# Patient Record
Sex: Male | Born: 1955 | Race: White | Hispanic: No | Marital: Married | Smoking: Never smoker
Health system: Southern US, Community
[De-identification: ages and names within clinical notes are randomized; demographics above are authoritative.]

## PROBLEM LIST (undated history)

## (undated) DIAGNOSIS — I10 Essential (primary) hypertension: Secondary | ICD-10-CM

## (undated) DIAGNOSIS — E785 Hyperlipidemia, unspecified: Secondary | ICD-10-CM

## (undated) DIAGNOSIS — R7303 Prediabetes: Secondary | ICD-10-CM

## (undated) DIAGNOSIS — K219 Gastro-esophageal reflux disease without esophagitis: Secondary | ICD-10-CM

## (undated) DIAGNOSIS — Z87442 Personal history of urinary calculi: Secondary | ICD-10-CM

## (undated) HISTORY — PX: TONSILLECTOMY: SUR1361

## (undated) HISTORY — DX: Essential (primary) hypertension: I10

## (undated) HISTORY — DX: Hyperlipidemia, unspecified: E78.5

## (undated) HISTORY — PX: FOOT SURGERY: SHX648

---

## 1975-05-31 HISTORY — PX: FOOT SURGERY: SHX648

## 1990-05-30 HISTORY — PX: FOOT FRACTURE SURGERY: SHX645

## 2005-10-27 ENCOUNTER — Ambulatory Visit: Payer: Self-pay | Admitting: Gastroenterology

## 2014-05-30 HISTORY — PX: COLONOSCOPY: SHX174

## 2015-01-05 DIAGNOSIS — E785 Hyperlipidemia, unspecified: Secondary | ICD-10-CM | POA: Insufficient documentation

## 2015-01-05 DIAGNOSIS — J309 Allergic rhinitis, unspecified: Secondary | ICD-10-CM | POA: Insufficient documentation

## 2016-12-27 DIAGNOSIS — K219 Gastro-esophageal reflux disease without esophagitis: Secondary | ICD-10-CM | POA: Insufficient documentation

## 2018-11-07 ENCOUNTER — Other Ambulatory Visit: Payer: Self-pay

## 2018-11-07 ENCOUNTER — Encounter: Payer: Self-pay | Admitting: *Deleted

## 2018-11-07 ENCOUNTER — Emergency Department: Payer: BC Managed Care – PPO

## 2018-11-07 ENCOUNTER — Emergency Department
Admission: EM | Admit: 2018-11-07 | Discharge: 2018-11-07 | Disposition: A | Discharge status: Home / Self Care | Payer: BC Managed Care – PPO | Attending: Emergency Medicine | Admitting: Emergency Medicine

## 2018-11-07 DIAGNOSIS — R0789 Other chest pain: Secondary | ICD-10-CM | POA: Insufficient documentation

## 2018-11-07 DIAGNOSIS — R079 Chest pain, unspecified: Secondary | ICD-10-CM | POA: Diagnosis present

## 2018-11-07 MED ORDER — METHOCARBAMOL 500 MG PO TABS: 500.0000 mg | ORAL_TABLET | Freq: Three times a day (TID) | ORAL | 0 refills | Status: AC | PRN

## 2018-11-07 MED ORDER — MELOXICAM 15 MG PO TABS: 15.0000 mg | ORAL_TABLET | Freq: Every day | ORAL | 1 refills | Status: AC

## 2018-11-07 NOTE — ED Notes (Signed)
Patient transported to X-ray 
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
## 2018-11-07 NOTE — Discharge Instructions (Signed)
Follow up with PCP regarding suspected thyroid goiter.

## 2018-12-25 DIAGNOSIS — E042 Nontoxic multinodular goiter: Secondary | ICD-10-CM | POA: Insufficient documentation

## 2019-01-15 ENCOUNTER — Other Ambulatory Visit: Payer: Self-pay | Admitting: General Surgery

## 2019-01-15 ENCOUNTER — Other Ambulatory Visit: Payer: Self-pay

## 2019-01-15 ENCOUNTER — Encounter: Payer: Self-pay | Admitting: General Surgery

## 2019-01-15 ENCOUNTER — Ambulatory Visit: Payer: BC Managed Care – PPO | Admitting: General Surgery

## 2019-01-15 VITALS — BP 178/97 | HR 76 | Temp 98.1°F | Ht 74.0 in | Wt 281.0 lb

## 2019-01-15 DIAGNOSIS — E049 Nontoxic goiter, unspecified: Secondary | ICD-10-CM
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
## 2019-01-15 NOTE — Patient Instructions (Addendum)
Thyroidectomy A thyroidectomy is a surgery that is done to remove the thyroid gland. The thyroid is a butterfly-shaped gland that is located at the lower front of your neck. It produces thyroid hormone, which is a substance that helps to control certain body processes. You may have a:  Total thyroidectomy. All of your thyroid is removed.  Thyroid lobectomy. Part of your thyroid is removed. The amount of thyroid gland tissue that is removed during your surgery depends on the reason for the procedure. Reasons to have this procedure include treatment for:  Thyroid nodules.  Thyroid cancer.  Benign thyroid tumors.  Goiter.  Overactive thyroid gland (hyperthyroidism). There are two ways to do this procedure. Conventional, or open, thyroidectomy uses one large incision to remove the thyroid gland. This is the most common method. Endoscopic thyroidectomy, a less invasive method, uses a narrow tube with a light and camera (endoscope) to remove the gland. Tell a health care provider about:  Any allergies you have.  All medicines you are taking, including vitamins, herbs, eye drops, creams, and over-the-counter medicines.  Any problems you or family members have had with anesthetic medicines.  Any blood disorders you have.  Any surgeries you have had.  Any medical conditions you have.  Whether you are pregnant or may be pregnant. What are the risks? Generally, this is a safe procedure. However, problems may occur, including:  Damage to the parathyroid glands. These are located behind your thyroid gland. They maintain the calcium levels in the body. Damage may lead to: ? A decrease in parathyroid hormone levels (hypoparathyroidism). ? A decrease in calcium levels. This will make your nerves irritable and may cause muscle spasms.  An increase in thyroid hormone.  Damage to the nerves of your voice box (larynx). This can be temporary or long-term (rare).  Hoarseness. This usually  resolves in 24-48 hours.  Bleeding.  Infection. What happens before the procedure? Staying hydrated Follow instructions from your health care provider about hydration, which may include:  Up to 2 hours before the procedure - you may continue to drink clear liquids, such as water, clear fruit juice, black coffee, and plain tea. Eating and drinking restrictions Follow instructions from your health care provider about eating and drinking, which may include:  8 hours before the procedure - stop eating heavy meals or foods such as meat, fried foods, or fatty foods.  6 hours before the procedure - stop eating light meals or foods, such as toast or cereal.  6 hours before the procedure - stop drinking milk or drinks that contain milk.  2 hours before the procedure - stop drinking clear liquids. Medicines Ask your health care provider about:  Changing or stopping your regular medicines. This is especially important if you are taking diabetes medicines or blood thinners.  Taking medicines such as aspirin and ibuprofen. These medicines can thin your blood. Do not take these medicines unless your health care provider tells you to take them.  Taking over-the-counter medicines, vitamins, herbs, and supplements. General instructions  You may be asked to shower with a germ-killing soap.  Plan to have someone take you home from the hospital or clinic.  Plan to have a responsible adult care for you for at least 24 hours after you leave the hospital or clinic. This is important. What happens during the procedure?  To reduce your risk of infection: ? Your health care team will wash or sanitize their hands. ? Hair may be removed from the surgical area. ?  Your skin will be washed with soap.  An IV will be inserted into one of your veins.  You will be given one or more of the following: ? A medicine to help you relax (sedative). ? A medicine to make you fall asleep (general anesthetic).   Your health care provider will perform your surgery using one of two methods: ? For open thyroidectomy, an incision will be made in your lower neck. Muscles in the area will be separated to reveal your thyroid gland. ? For endoscopic thyroidectomy, several small incisions will be made in your neck, chest, or armpit. An endoscopewill be inserted into an incision.  Your health care provider may monitor laryngeal nerve function during the procedure for safety reasons.  Part or all of your thyroid gland will be removed.  A tube (drain) may be placed at the incision site to drain blood and fluids that accumulate under the skin after the procedure. The drain may have to stay in place for a day or two after the procedure.  The incision will be closed with stitches (sutures).  A dressing will be placed over your incision. The procedure may vary among health care providers and hospitals. What happens after the procedure?  Your blood pressure, heart rate, breathing rate, and blood oxygen level will be monitored often until the medicines you were given have worn off.  You will be given pain medicine as needed.  Your provider will check your ability to talk and swallow after the procedure.  You will gradually start to drink liquids and have soft foods as tolerated.  You may have a blood test to check the level of calcium in your body.  If you had a drain put in during the procedure, it will usually be removed the next day. Summary  A thyroidectomy is a surgery that is done to remove the thyroid gland.  The procedure will be done in one of two ways: conventional, or open, thyroidectomy or endoscopic thyroidectomy.  Serious complications are rare.  Plan to have a responsible adult care for you for at least 24 hours after you leave the hospital or clinic. This is important. This information is not intended to replace advice given to you by your health care provider. Make sure you discuss any  questions you have with your health care provider. Document Released: 11/09/2000 Document Revised: 04/28/2017 Document Reviewed: 03/21/2017 Elsevier Patient Education  2020 [place] scan on 01/17/19 @ 12:45 pm [address]. appt 1:00

## 2019-01-16 ENCOUNTER — Telehealth: Payer: Self-pay | Admitting: *Deleted

## 2019-01-16 NOTE — Telephone Encounter (Signed)
Patient's surgery to be scheduled for 01-23-19 at [facility] with Dr. Celine Ahr. Dr. Nestor Lewandowsky will be assisting with this case.   The patient is aware to have COVID-19 testing done on 01-18-19 at the [facility] drive thru ([address] [place]). We will try and coordinate this with his pre-admit appointment. He is aware to isolate after, have no visitors, wash hands frequently, and avoid touching face.   The patient is aware he will need to Pre-Admit. Patient will be contacted once Pre-admission appointment has been arranged with date and time.   The patient will be contacted with further instructions once [facility] has posted.

## 2019-01-17 ENCOUNTER — Ambulatory Visit
Admission: RE | Admit: 2019-01-17 | Discharge: 2019-01-17 | Disposition: A | Discharge status: Home / Self Care | Payer: BC Managed Care – PPO | Source: Ambulatory Visit | Attending: General Surgery | Admitting: General Surgery

## 2019-01-17 ENCOUNTER — Other Ambulatory Visit: Payer: Self-pay

## 2019-01-17 DIAGNOSIS — E049 Nontoxic goiter, unspecified: Secondary | ICD-10-CM | POA: Diagnosis present

## 2019-01-17 NOTE — Telephone Encounter (Signed)
Patient contacted and notified surgery has been scheduled with [facility].   The patient is aware to Pre-admit on 01-21-19 at 9 am and will check in at the [facility]. He is aware to have COVID testing done after at the [facility].

## 2019-01-21 ENCOUNTER — Other Ambulatory Visit: Payer: Self-pay

## 2019-01-21 ENCOUNTER — Encounter
Admission: RE | Admit: 2019-01-21 | Discharge: 2019-01-21 | Disposition: A | Discharge status: Home / Self Care | Payer: BC Managed Care – PPO | Source: Ambulatory Visit | Attending: General Surgery | Admitting: General Surgery

## 2019-01-21 ENCOUNTER — Telehealth: Payer: Self-pay | Admitting: *Deleted

## 2019-01-21 ENCOUNTER — Encounter: Payer: Self-pay | Admitting: *Deleted

## 2019-01-21 ENCOUNTER — Telehealth: Payer: Self-pay | Admitting: General Surgery

## 2019-01-21 ENCOUNTER — Encounter: Payer: Self-pay | Admitting: General Surgery

## 2019-01-21 DIAGNOSIS — E049 Nontoxic goiter, unspecified: Secondary | ICD-10-CM

## 2019-01-21 NOTE — Telephone Encounter (Signed)
Patient called back and was notified per previous message. He verbalizes understanding.   The patient states the hospital already handled his refund.

## 2019-01-21 NOTE — Telephone Encounter (Signed)
Spoke with patient regarding need to refer to larger institution for surgery. He prefers [facility]. Will make appropriate referral. He also said that he has paid $500 toward his operation already. Will ask our office staff to work on getting that refunded.
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
## 2019-01-21 NOTE — Telephone Encounter (Signed)
Tried to reach the patient today but no answer and not able to leave a message.   We need to inform patient that we have forwarded his records to [facility]. They will be in touch with him about an appointment. (Phone: [phone]: [phone]).  Also, we need to give patient the number to the pre-service center so he can call about refund since he will not be having surgery with Dr. Celine Ahr on 01-23-19. Their number is [phone] option 1.

## 2019-01-23 ENCOUNTER — Ambulatory Visit: Admission: RE | Admit: 2019-01-23 | Payer: BC Managed Care – PPO | Source: Home / Self Care | Admitting: General Surgery

## 2019-01-23 ENCOUNTER — Encounter: Admission: RE | Payer: Self-pay | Source: Home / Self Care

## 2019-01-23 SURGERY — THYROIDECTOMY
Anesthesia: General

## 2019-01-30 ENCOUNTER — Encounter: Payer: Self-pay | Admitting: *Deleted

## 2019-01-30 NOTE — Progress Notes (Signed)
Patient has been scheduled for an appointment with Dr. Harland German at [facility] for 02-01-19 at 9:15 am. Patient has been notified of the appointment per [facility] (Phone: [phone] option 1/Fax: [phone]).

## 2019-02-15 DIAGNOSIS — I10 Essential (primary) hypertension: Secondary | ICD-10-CM | POA: Insufficient documentation

## 2019-02-15 DIAGNOSIS — R7303 Prediabetes: Secondary | ICD-10-CM | POA: Insufficient documentation

## 2019-11-15 ENCOUNTER — Ambulatory Visit: Payer: BC Managed Care – PPO
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
## 2020-01-30 IMAGING — CT CT NECK WITHOUT CONTRAST
5 series · 16 of 33 positions shown, 18 images · non-contrast
Comparison: None.

CLINICAL DATA: Goiter.  Dysphagia

EXAM:
CT NECK WITHOUT CONTRAST
TECHNIQUE: Multidetector CT imaging of the neck was performed following the
standard protocol without intravenous contrast.

[Series 2: axial neck · axial · 0.63mm/px · z∈[-691,-555]mm · 3 of 138 slices shown]
[im 35/138  bone]
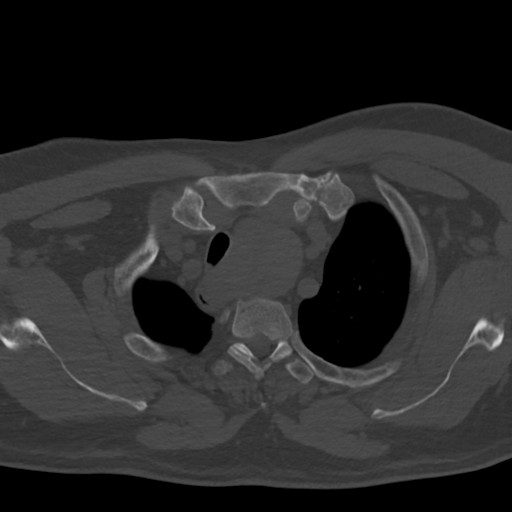
[im 69/138  bone]
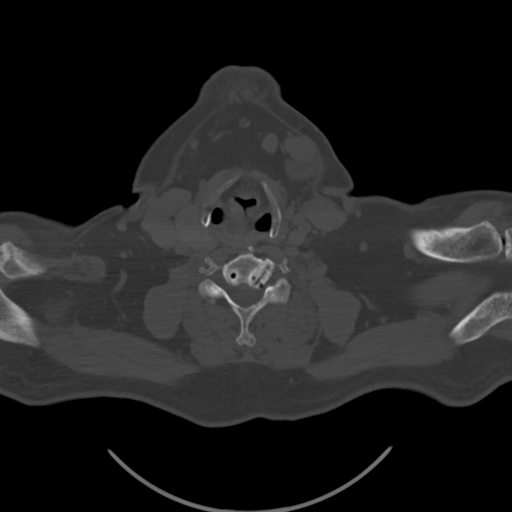
[im 103/138  bone]
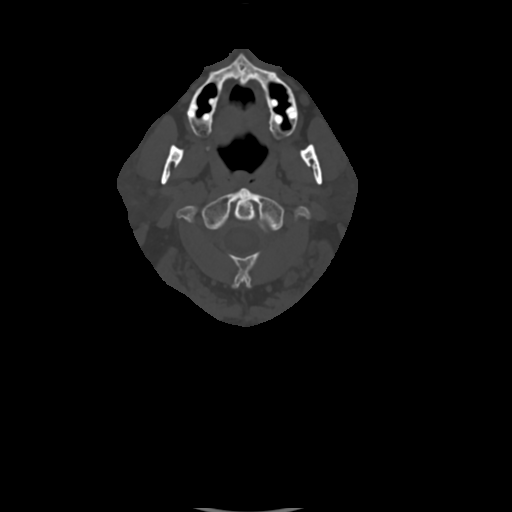

[Series 3: axial bone neck · axial · 0.63mm/px · z∈[-669,-577]mm · 2 of 138 slices shown]
[im 46/138  bone]
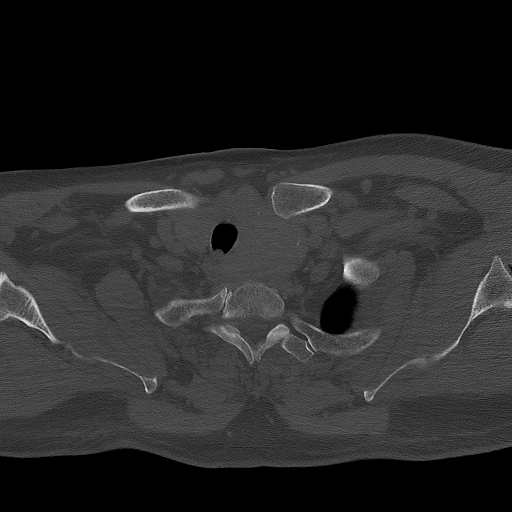
[im 92/138  bone]
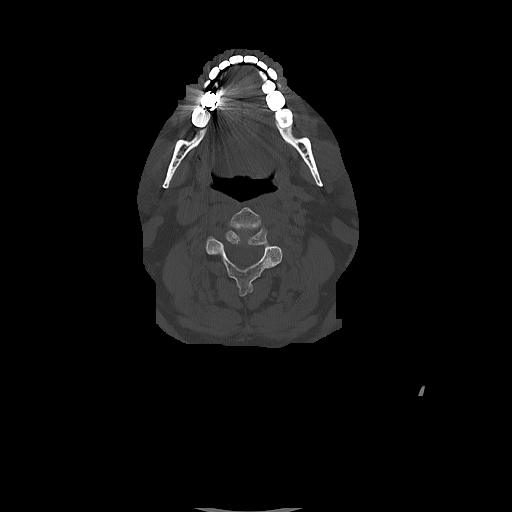

[Series 4: coronal neck · coronal · 0.67mm/px · 3 of 166 slices shown]
[im 50/166  bone]
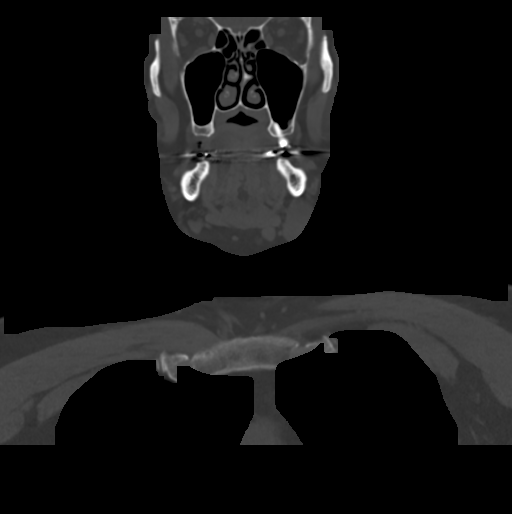
[im 72/166  bone]
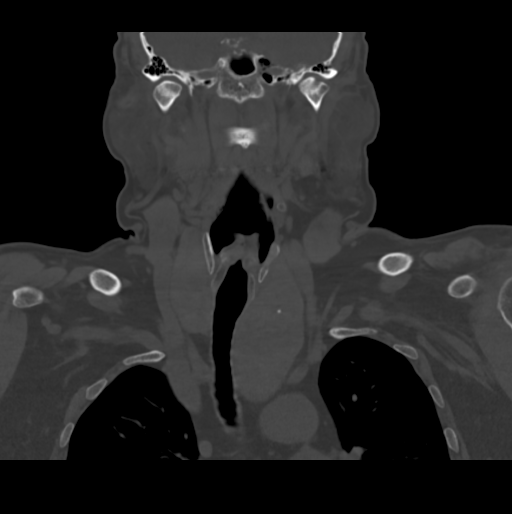
[im 94/166  bone]
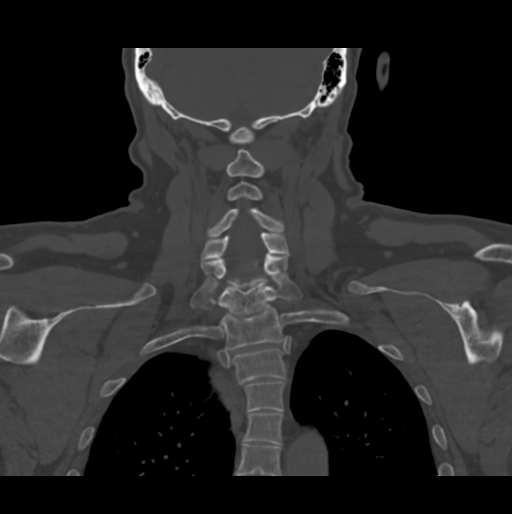

[Series 6: sagittal neck · sagittal · 0.65mm/px · 5 of 170 slices shown, 6 images]
[im 57/170  bone]
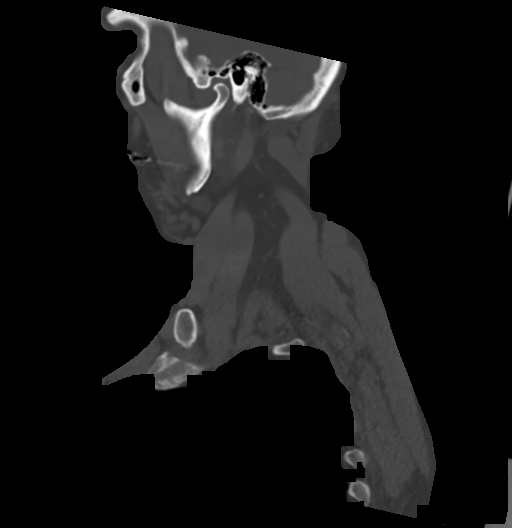
[im 71/170  bone]
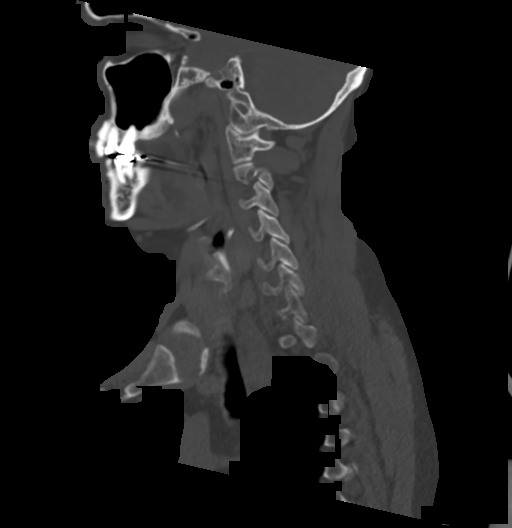
[im 85/170  soft-tissue]
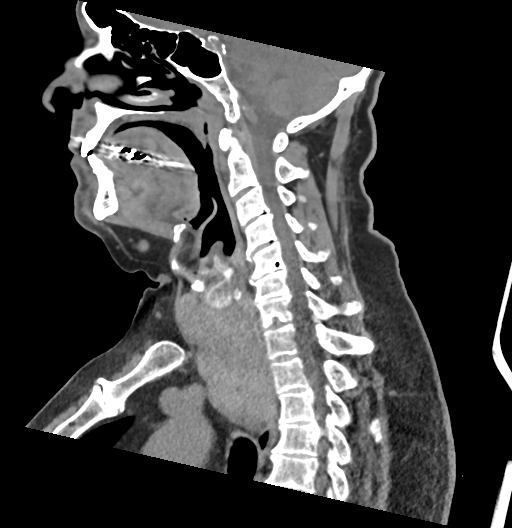
[im 85/170  bone]
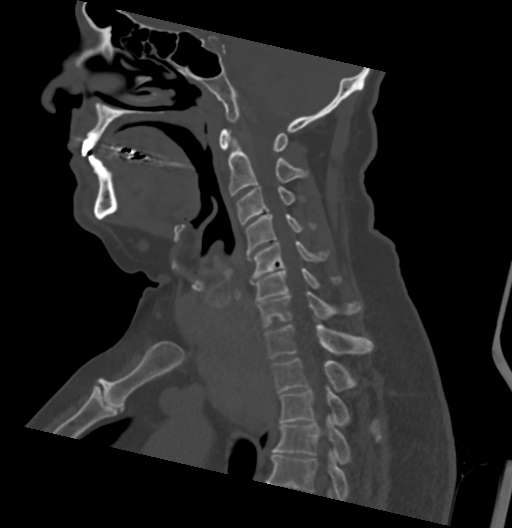
[im 99/170  bone]
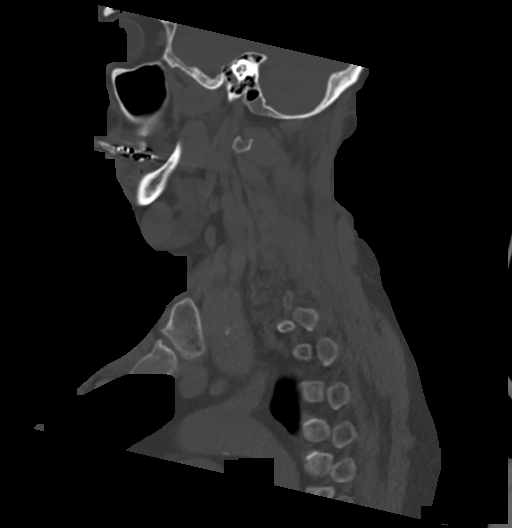
[im 113/170  bone]
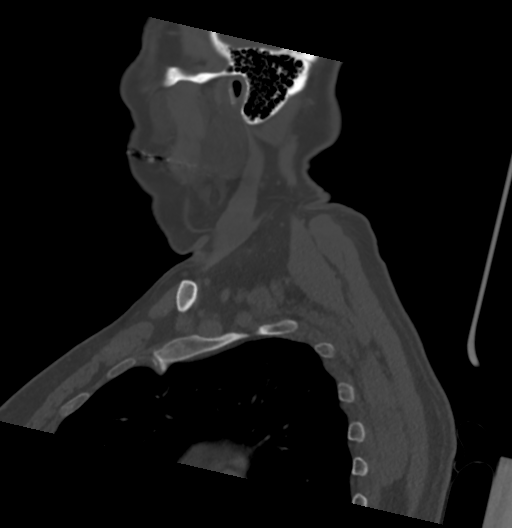

[Series 8: ax oropharynx neck · axial · 0.65mm/px · z∈[-745,-579]mm · 3 of 171 slices shown, 4 images]
[im 43/171  soft-tissue]
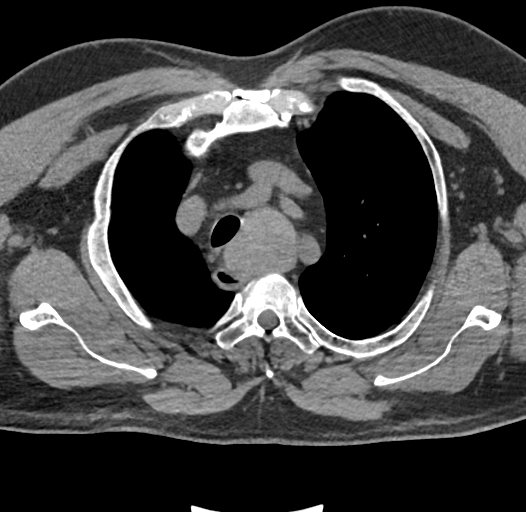
[im 43/171  bone]
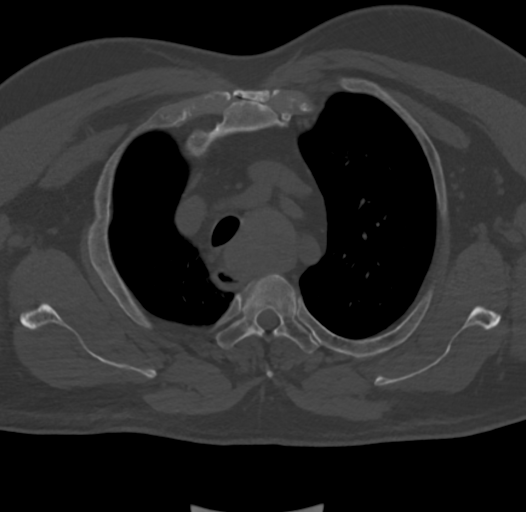
[im 86/171  bone]
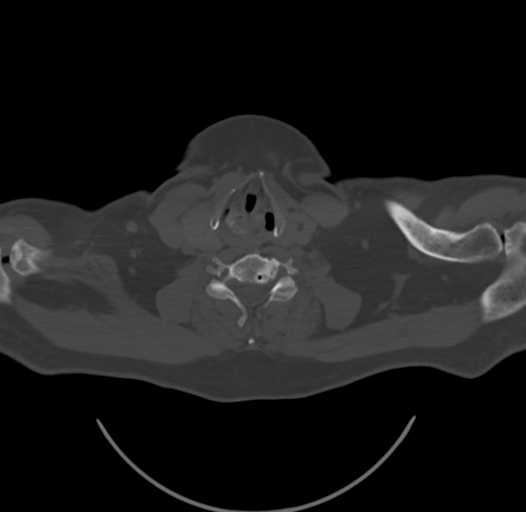
[im 128/171  bone]
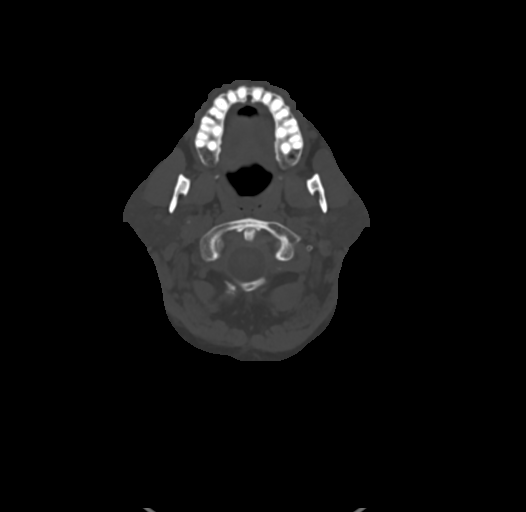

[16 of 33 positions shown; findings below may reference images not displayed]

FINDINGS: Pharynx and larynx: Normal. No mass or swelling.

Salivary glands: No inflammation, mass, or stone.

Thyroid: Goiter. Enlarged lobular thyroid bilaterally left greater
than right. Left thyroid measures 49 x 58 x 105 mm. Punctate
benign-appearing calcifications in the left thyroid. Substernal
extension of the left thyroid goiter into the superior mediastinum
to the level of the aortic arch. The trachea is displaced to the
right. The esophagus also is displaced to the right and posteriorly.
No thyroid mass. Moderate enlargement right lobe of the thyroid.

Lymph nodes: Enlarged left submandibular lymph node measuring 23 x
28 mm. Additional smaller submental nodes on the left measuring 10
mm and 12 mm. Right submental lymph node 9 mm.

Vascular: Limited vascular evaluation without intravenous contrast

Limited intracranial: Negative

Visualized orbits: Negative

Mastoids and visualized paranasal sinuses: Negative

Skeleton: No acute skeletal abnormality.

Upper chest: Lung apices clear bilaterally.

Other: None
IMPRESSION: Enlargement of the thyroid bilaterally compatible with goiter. Left
lobe is especially large and extends into the mediastinum displacing
the trachea and esophagus and could account for dysphagia. No
thyroid mass

Enlarged left sub mandibular lymph node 23 x 28 mm with additional
prominent submental nodes bilaterally. Correlate with oral exam.
Recommend biopsy of this lymph node.

## 2021-03-15 ENCOUNTER — Encounter: Payer: Self-pay | Admitting: General Surgery

## 2021-12-17 ENCOUNTER — Encounter: Payer: Self-pay | Admitting: Podiatry

## 2021-12-17 ENCOUNTER — Ambulatory Visit (INDEPENDENT_AMBULATORY_CARE_PROVIDER_SITE_OTHER): Payer: Medicare PPO

## 2021-12-17 ENCOUNTER — Ambulatory Visit: Payer: Medicare PPO | Admitting: Podiatry

## 2021-12-17 DIAGNOSIS — R269 Unspecified abnormalities of gait and mobility: Secondary | ICD-10-CM | POA: Diagnosis not present

## 2021-12-17 DIAGNOSIS — M19079 Primary osteoarthritis, unspecified ankle and foot: Secondary | ICD-10-CM

## 2021-12-17 DIAGNOSIS — M19071 Primary osteoarthritis, right ankle and foot: Secondary | ICD-10-CM | POA: Diagnosis not present

## 2021-12-17 NOTE — Progress Notes (Signed)
Patient presents today to be casted for custom molded orthotics. Dr. Logan Bores has been treating patient for altered gait.   Impression foam cast was taken. ABN signed.  Patient info-  Shoe size: 13.5 wide  Shoe style: athletic shoe (Hoka)  Height: 6'0.5"  Weight: 270lbs   Patient will be notified once orthotics arrive in office and reappoint for fitting at that time.

## 2021-12-17 NOTE — Progress Notes (Signed)
   Chief Complaint  Patient presents with   Foot Orthotics    Wife stated, "Looks like when he walks, he lifts his left foot up funny, so, we want to have his gait checked.  His right knee has been bothering him.  He may need orthotics."    HPI: 66 y.o. male presenting today as a new patient with his wife for evaluation of altered gait.  Patient states that he does have a history of right knee pain which is causing him to create an altered gait.  His wife wears orthotics which have helped.  They would like to have his feet evaluated to ensure there is nothing contributing to his altered gait from a foot or ankle standpoint  Past Medical History:  Diagnosis Date   Hyperlipidemia    Hypertension     Past Surgical History:  Procedure Laterality Date   COLONOSCOPY  2016   FOOT SURGERY  le   TONSILLECTOMY     child    Allergies  Allergen Reactions   Codeine Nausea Only     Physical Exam: General: The patient is alert and oriented x3 in no acute distress.  Dermatology: Skin is warm, dry and supple bilateral lower extremities. Negative for open lesions or macerations.  Vascular: Palpable pedal pulses bilaterally. Capillary refill within normal limits.  Negative for any significant edema or erythema  Neurological: Light touch and protective threshold grossly intact  Musculoskeletal Exam: No pedal deformities noted.  Genu varum noted with degenerative changes and pain to the right knee specifically Altered gait pattern noted with compensation and antalgic gait favoring the right knee  Assessment: 1.  RT knee DJD with altered gait    Plan of Care:  1. Patient evaluated.  2.  After discussing with the patient and evaluating the patient's gait he may benefit from custom molded orthotics.  His wife wears custom molded orthotics with significant improvement. 3.  Appointment for custom molded orthotics 4.  Continue wearing good supportive shoes and sneakers.  Patient currently  wearing Hoka's 5.  Return to clinic as needed     Felecia Shelling, DPM [facility]  Dr. Felecia Shelling, DPM    [address] [place], [place] [address]                Office [phone]  Fax [phone]

## 2022-01-11 ENCOUNTER — Telehealth: Payer: Self-pay | Admitting: Podiatry

## 2022-01-11 NOTE — Telephone Encounter (Signed)
Unable to lvm for patient to schedule appointment for inserts pick up

## 2022-03-02 ENCOUNTER — Ambulatory Visit (INDEPENDENT_AMBULATORY_CARE_PROVIDER_SITE_OTHER): Payer: Medicare PPO | Admitting: Podiatry

## 2022-03-02 DIAGNOSIS — M19079 Primary osteoarthritis, unspecified ankle and foot: Secondary | ICD-10-CM

## 2022-03-02 NOTE — Progress Notes (Signed)
Patient presents today to pick up custom molded foot orthotics recommended by Dr. Amalia Hailey.   Orthotics were dispensed and fit was satisfactory. Reviewed instructions for break-in and wear. Written instructions given to patient.  Patient will follow up as needed.   [facility] - order # [id number]

## 2023-12-31 DIAGNOSIS — M1711 Unilateral primary osteoarthritis, right knee: Principal | ICD-10-CM | POA: Insufficient documentation

## 2024-02-02 ENCOUNTER — Encounter
Admission: RE | Admit: 2024-02-02 | Discharge: 2024-02-02 | Disposition: A | Discharge status: Home / Self Care | Payer: Self-pay | Source: Ambulatory Visit | Attending: Orthopedic Surgery | Admitting: Orthopedic Surgery

## 2024-02-02 ENCOUNTER — Other Ambulatory Visit: Payer: Self-pay

## 2024-02-02 VITALS — BP 146/82 | HR 62 | Resp 16 | Ht 73.0 in | Wt 282.0 lb

## 2024-02-02 DIAGNOSIS — R7303 Prediabetes: Secondary | ICD-10-CM | POA: Insufficient documentation

## 2024-02-02 DIAGNOSIS — Z01818 Encounter for other preprocedural examination: Secondary | ICD-10-CM | POA: Insufficient documentation

## 2024-02-02 DIAGNOSIS — M1711 Unilateral primary osteoarthritis, right knee: Secondary | ICD-10-CM | POA: Insufficient documentation

## 2024-02-02 HISTORY — DX: Prediabetes: R73.03

## 2024-02-02 HISTORY — DX: Gastro-esophageal reflux disease without esophagitis: K21.9

## 2024-02-02 HISTORY — DX: Personal history of urinary calculi: Z87.442

## 2024-02-02 LAB — COMPREHENSIVE METABOLIC PANEL WITH GFR
ALT: 46 U/L — ABNORMAL HIGH (ref 0–44)
AST: 35 U/L (ref 15–41)
Albumin: 4.1 g/dL (ref 3.5–5.0)
Alkaline Phosphatase: 68 U/L (ref 38–126)
Anion gap: 8 (ref 5–15)
BUN: 19 mg/dL (ref 8–23)
CO2: 23 mmol/L (ref 22–32)
Calcium: 9.2 mg/dL (ref 8.9–10.3)
Chloride: 104 mmol/L (ref 98–111)
Creatinine, Ser: 1.13 mg/dL (ref 0.61–1.24)
GFR, Estimated: >60 mL/min (ref >60)
Glucose, Bld: 102 mg/dL — ABNORMAL HIGH (ref 70–99)
Potassium: 4.1 mmol/L (ref 3.5–5.1)
Sodium: 135 mmol/L (ref 135–145)
Total Bilirubin: 1 mg/dL (ref 0.0–1.2)
Total Protein: 7.7 g/dL (ref 6.5–8.1)

## 2024-02-02 LAB — CBC
HCT: 47.6 % (ref 39.0–52.0)
Hemoglobin: 16.1 g/dL (ref 13.0–17.0)
MCH: 29.4 pg (ref 26.0–34.0)
MCHC: 33.8 g/dL (ref 30.0–36.0)
MCV: 86.9 fL (ref 80.0–100.0)
Platelets: 346 K/uL (ref 150–400)
RBC: 5.48 MIL/uL (ref 4.22–5.81)
RDW: 12.7 % (ref 11.5–15.5)
WBC: 6.7 K/uL (ref 4.0–10.5)
nRBC: 0 % (ref 0.0–0.2)

## 2024-02-02 LAB — URINALYSIS, ROUTINE W REFLEX MICROSCOPIC
Bilirubin Urine: NEGATIVE
Glucose, UA: NEGATIVE mg/dL
Hgb urine dipstick: NEGATIVE
Ketones, ur: NEGATIVE mg/dL
Leukocytes,Ua: NEGATIVE
Nitrite: NEGATIVE
Protein, ur: NEGATIVE mg/dL
Specific Gravity, Urine: 1.013 (ref 1.005–1.030)
pH: 5 (ref 5.0–8.0)

## 2024-02-02 LAB — C-REACTIVE PROTEIN: CRP: <0.5 mg/dL (ref <1.0)

## 2024-02-02 LAB — SURGICAL PCR SCREEN
MRSA, PCR: NEGATIVE
Staphylococcus aureus: NEGATIVE

## 2024-02-02 LAB — SEDIMENTATION RATE: Sed Rate: 4 mm/h (ref 0–20)

## 2024-02-02 NOTE — Discharge Instructions (Signed)

## 2024-02-02 NOTE — Patient Instructions (Addendum)
 Your procedure is scheduled on: Monday 02/12/24 Report to the Registration Desk on the 1st floor of the [facility]. To find out your arrival time, please call [phone] between 1PM - 3PM on: Friday 02/09/24 If your arrival time is 6:00 am, do not arrive before that time as the [facility] entrance doors do not open until 6:00 am.  REMEMBER: Instructions that are not followed completely may result in serious medical risk, up to and including death; or upon the discretion of your surgeon and anesthesiologist your surgery may need to be rescheduled.  Do not eat food after midnight the night before surgery.  No gum chewing or hard candies.  You may however, drink CLEAR liquids up to 2 hours before you are scheduled to arrive for your surgery. Do not drink anything within 2 hours of your scheduled arrival time.  Clear liquids include: - water  - apple juice without pulp - gatorade (not RED colors) - black coffee or tea (Do NOT add milk or creamers to the coffee or tea) Do NOT drink anything that is not on this list.  In addition, your doctor has ordered for you to drink the provided:  Ensure Pre-Surgery Clear Carbohydrate Drink  Drinking this carbohydrate drink up to two hours before surgery helps to reduce insulin resistance and improve patient outcomes. Please complete drinking 2 hours before scheduled arrival time.  One week prior to surgery: Stop Anti-inflammatories (NSAIDS) such as Advil, Aleve, Ibuprofen, Motrin, Naproxen, Naprosyn and Aspirin based products such as Excedrin, Goody's Powder, BC Powder.  You may however, continue to take Tylenol if needed for pain up until the day of surgery  Stop ANY OVER THE COUNTER supplements and vitamins until after surgery.  Continue taking all of your other prescription medications up until the day of surgery.  ON THE DAY OF SURGERY ONLY TAKE THESE MEDICATIONS WITH SIPS OF WATER:  levothyroxine (SYNTHROID) 175 MCG tablet  pantoprazole  (PROTONIX) 40 MG tablet   No Alcohol for 24 hours before or after surgery.  No Smoking including e-cigarettes for 24 hours before surgery.  No chewable tobacco products for at least 6 hours before surgery.  No nicotine patches on the day of surgery.  Do not use any recreational drugs for at least a week (preferably 2 weeks) before your surgery.  Please be advised that the combination of cocaine and anesthesia may have negative outcomes, up to and including death. If you test positive for cocaine, your surgery will be cancelled.  On the morning of surgery brush your teeth with toothpaste and water, you may rinse your mouth with mouthwash if you wish. Do not swallow any toothpaste or mouthwash.  Use CHG Soap or wipes as directed on instruction sheet.  Do not wear lotions, powders, or perfumes the day of surgery  Do not shave body hair from the neck down 48 hours before surgery.  Wear comfortable clothing (specific to your surgery type) to the hospital.  Do not wear jewelry, make-up, hairpins, clips or nail polish.  For welded (permanent) jewelry: bracelets, anklets, waist bands, etc.  Please have this removed prior to surgery.  If it is not removed, there is a chance that hospital personnel will need to cut it off on the day of surgery.  Contact lenses, hearing aids and dentures may not be worn into surgery.  Do not bring valuables to the hospital. [facility] is not responsible for any missing/lost belongings or valuables.   Notify your doctor if there  is any change in your medical condition (cold, fever, infection).  After surgery, you can help prevent lung complications by doing breathing exercises.  Take deep breaths and cough every 1-2 hours. Your doctor may order a device called an Incentive Spirometer to help you take deep breaths.  If you are being admitted to the hospital overnight, leave your suitcase in the car. After surgery it may be brought to your room.  In case  of increased patient census, it may be necessary for you, the patient, to continue your postoperative care in the Same Day Surgery department.  Please call the Pre-admissions Testing Dept. at [phone] if you have any questions about these instructions.  Surgery Visitation Policy:  Patients having surgery or a procedure may have two visitors.  Children under the age of 9 must have an adult with them who is not the patient.  Inpatient Visitation:    Visiting hours are 7 a.m. to 8 p.m. Up to four visitors are allowed at one time in a patient room. The visitors may rotate out with other people during the day.  One visitor age 79 or older may stay with the patient overnight and must be in the room by 8 p.m.   Merchandiser, retail to address health-related social needs:  [email or web address]    Pre-operative 5 CHG Bath Instructions   You can play a key role in reducing the risk of infection after surgery. Your skin needs to be as free of germs as possible. You can reduce the number of germs on your skin by washing with CHG (chlorhexidine gluconate) soap before surgery. CHG is an antiseptic soap that kills germs and continues to kill germs even after washing.   DO NOT use if you have an allergy to chlorhexidine/CHG or antibacterial soaps. If your skin becomes reddened or irritated, stop using the CHG and notify one of our RNs at [phone].   Please shower with the CHG soap starting 4 days before surgery using the following schedule:   Thursday 02/08/24 - Monday 02/12/24    Please keep in mind the following:  DO NOT shave, including legs and underarms, starting the day of your first shower.   You may shave your face at any point before/day of surgery.  Place clean sheets on your bed the day you start using CHG soap. Use a clean washcloth (not used since being washed) for each shower. DO NOT sleep with pets once you start using the CHG.   CHG Shower  Instructions:  If you choose to wash your hair and private area, wash first with your normal shampoo/soap.  After you use shampoo/soap, rinse your hair and body thoroughly to remove shampoo/soap residue.  Turn the water OFF and apply about 3 tablespoons (45 ml) of CHG soap to a CLEAN washcloth.  Apply CHG soap ONLY FROM YOUR NECK DOWN TO YOUR TOES (washing for 3-5 minutes)  DO NOT use CHG soap on face, private areas, open wounds, or sores.  Pay special attention to the area where your surgery is being performed.  If you are having back surgery, having someone wash your back for you may be helpful. Wait 2 minutes after CHG soap is applied, then you may rinse off the CHG soap.  Pat dry with a clean towel  Put on clean clothes/pajamas   If you choose to wear lotion, please use ONLY the CHG-compatible lotions on the back of this paper.     Additional instructions for the day of  surgery: DO NOT APPLY any lotions, deodorants, cologne, or perfumes.   Put on clean/comfortable clothes.  Brush your teeth.  Ask your nurse before applying any prescription medications to the skin.      CHG Compatible Lotions   Aveeno Moisturizing lotion  Cetaphil Moisturizing Cream  Cetaphil Moisturizing Lotion  Clairol Herbal Essence Moisturizing Lotion, Dry Skin  Clairol Herbal Essence Moisturizing Lotion, Extra Dry Skin  Clairol Herbal Essence Moisturizing Lotion, Normal Skin  Curel Age Defying Therapeutic Moisturizing Lotion with Alpha Hydroxy  Curel Extreme Care Body Lotion  Curel Soothing Hands Moisturizing Hand Lotion  Curel Therapeutic Moisturizing Cream, Fragrance-Free  Curel Therapeutic Moisturizing Lotion, Fragrance-Free  Curel Therapeutic Moisturizing Lotion, Original Formula  Eucerin Daily Replenishing Lotion  Eucerin Dry Skin Therapy Plus Alpha Hydroxy Crme  Eucerin Dry Skin Therapy Plus Alpha Hydroxy Lotion  Eucerin Original Crme  Eucerin Original Lotion  Eucerin Plus Crme Eucerin Plus  Lotion  Eucerin TriLipid Replenishing Lotion  Keri Anti-Bacterial Hand Lotion  Keri Deep Conditioning Original Lotion Dry Skin Formula Softly Scented  Keri Deep Conditioning Original Lotion, Fragrance Free Sensitive Skin Formula  Keri Lotion Fast Absorbing Fragrance Free Sensitive Skin Formula  Keri Lotion Fast Absorbing Softly Scented Dry Skin Formula  Keri Original Lotion  Keri Skin Renewal Lotion Keri Silky Smooth Lotion  Keri Silky Smooth Sensitive Skin Lotion  Nivea Body Creamy Conditioning Oil  Nivea Body Extra Enriched Lotion  Nivea Body Original Lotion  Nivea Body Sheer Moisturizing Lotion Nivea Crme  Nivea Skin Firming Lotion  NutraDerm 30 Skin Lotion  NutraDerm Skin Lotion  NutraDerm Therapeutic Skin Cream  NutraDerm Therapeutic Skin Lotion  ProShield Protective Hand Cream  Provon moisturizing lotion  How to Use an Incentive Spirometer  An incentive spirometer is a tool that measures how well you are filling your lungs with each breath. Learning to take long, deep breaths using this tool can help you keep your lungs clear and active. This may help to reverse or lessen your chance of developing breathing (pulmonary) problems, especially infection. You may be asked to use a spirometer: After a surgery. If you have a lung problem or a history of smoking. After a long period of time when you have been unable to move or be active. If the spirometer includes an indicator to show the highest number that you have reached, your health care provider or respiratory therapist will help you set a goal. Keep a log of your progress as told by your health care provider. What are the risks? Breathing too quickly may cause dizziness or cause you to pass out. Take your time so you do not get dizzy or light-headed. If you are in pain, you may need to take pain medicine before doing incentive spirometry. It is harder to take a deep breath if you are having pain. How to use your incentive  spirometer  Sit up on the edge of your bed or on a chair. Hold the incentive spirometer so that it is in an upright position. Before you use the spirometer, breathe out normally. Place the mouthpiece in your mouth. Make sure your lips are closed tightly around it. Breathe in slowly and as deeply as you can through your mouth, causing the piston or the ball to rise toward the top of the chamber. Hold your breath for 3-5 seconds, or for as long as possible. If the spirometer includes a coach indicator, use this to guide you in breathing. Slow down your breathing if the indicator goes above  the marked areas. Remove the mouthpiece from your mouth and breathe out normally. The piston or ball will return to the bottom of the chamber. Rest for a few seconds, then repeat the steps 10 or more times. Take your time and take a few normal breaths between deep breaths so that you do not get dizzy or light-headed. Do this every 1-2 hours when you are awake. If the spirometer includes a goal marker to show the highest number you have reached (best effort), use this as a goal to work toward during each repetition. After each set of 10 deep breaths, cough a few times. This will help to make sure that your lungs are clear. If you have an incision on your chest or abdomen from surgery, place a pillow or a rolled-up towel firmly against the incision when you cough. This can help to reduce pain while taking deep breaths and coughing. General tips When you are able to get out of bed: Walk around often. Continue to take deep breaths and cough in order to clear your lungs. Keep using the incentive spirometer until your health care provider says it is okay to stop using it. If you have been in the hospital, you may be told to keep using the spirometer at home. Contact a health care provider if: You are having difficulty using the spirometer. You have trouble using the spirometer as often as instructed. Your pain  medicine is not giving enough relief for you to use the spirometer as told. You have a fever. Get help right away if: You develop shortness of breath. You develop a cough with bloody mucus from the lungs. You have fluid or blood coming from an incision site after you cough. Summary An incentive spirometer is a tool that can help you learn to take long, deep breaths to keep your lungs clear and active. You may be asked to use a spirometer after a surgery, if you have a lung problem or a history of smoking, or if you have been inactive for a long period of time. Use your incentive spirometer as instructed every 1-2 hours while you are awake. If you have an incision on your chest or abdomen, place a pillow or a rolled-up towel firmly against your incision when you cough. This will help to reduce pain. Get help right away if you have shortness of breath, you cough up bloody mucus, or blood comes from your incision when you cough. This information is not intended to replace advice given to you by your health care provider. Make sure you discuss any questions you have with your health care provider. Document Revised: 08/05/2019 Document Reviewed: 08/05/2019 Elsevier Patient Education  2023 [employer].

## 2024-02-03 LAB — HEMOGLOBIN A1C
Hgb A1c MFr Bld: 6.1 % — ABNORMAL HIGH (ref 4.8–5.6)
Mean Plasma Glucose: 128 mg/dL

## 2024-02-11 ENCOUNTER — Encounter: Payer: Self-pay | Admitting: Orthopedic Surgery

## 2024-02-11 MED ORDER — CHLORHEXIDINE GLUCONATE 0.12 % MT SOLN
15.0000 mL | Freq: Once | OROMUCOSAL | Status: AC
Start: 2024-02-11 — End: 2024-02-12
  Administered 2024-02-12: 15 mL via OROMUCOSAL

## 2024-02-11 MED ORDER — CEFAZOLIN SODIUM-DEXTROSE 3-4 GM/150ML-% IV SOLN
3.0000 g | INTRAVENOUS | Status: AC
  Administered 2024-02-12: 3 g via INTRAVENOUS
  Filled 2024-02-11: qty 150

## 2024-02-11 MED ORDER — LACTATED RINGERS IV SOLN: INTRAVENOUS | Status: DC

## 2024-02-11 MED ORDER — ORAL CARE MOUTH RINSE: 15.0000 mL | Freq: Once | OROMUCOSAL | Status: AC

## 2024-02-11 MED ORDER — TRANEXAMIC ACID-NACL 1000-0.7 MG/100ML-% IV SOLN
1000.0000 mg | INTRAVENOUS | Status: AC
  Administered 2024-02-12: 1000 mg via INTRAVENOUS

## 2024-02-11 MED ORDER — CHLORHEXIDINE GLUCONATE 4 % EX SOLN: 60.0000 mL | Freq: Once | CUTANEOUS | Status: DC

## 2024-02-11 MED ORDER — GABAPENTIN 300 MG PO CAPS
300.0000 mg | ORAL_CAPSULE | Freq: Once | ORAL | Status: AC
  Administered 2024-02-12: 300 mg via ORAL

## 2024-02-11 MED ORDER — DEXAMETHASONE SODIUM PHOSPHATE 10 MG/ML IJ SOLN
8.0000 mg | Freq: Once | INTRAMUSCULAR | Status: AC
  Administered 2024-02-12: 8 mg via INTRAVENOUS
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
## 2024-02-12 ENCOUNTER — Ambulatory Visit: Payer: Self-pay | Admitting: Urgent Care

## 2024-02-12 ENCOUNTER — Other Ambulatory Visit: Payer: Self-pay

## 2024-02-12 ENCOUNTER — Encounter: Payer: Self-pay | Admitting: Orthopedic Surgery

## 2024-02-12 ENCOUNTER — Observation Stay
Admission: RE | Admit: 2024-02-12 | Discharge: 2024-02-13 | Disposition: A | Discharge status: Home / Self Care | Payer: Self-pay | Attending: Orthopedic Surgery | Admitting: Orthopedic Surgery

## 2024-02-12 ENCOUNTER — Encounter
Admission: RE | Disposition: A | Discharge status: Home / Self Care | Payer: Self-pay | Source: Home / Self Care | Attending: Orthopedic Surgery

## 2024-02-12 ENCOUNTER — Observation Stay

## 2024-02-12 DIAGNOSIS — M1711 Unilateral primary osteoarthritis, right knee: Principal | ICD-10-CM | POA: Insufficient documentation

## 2024-02-12 DIAGNOSIS — I10 Essential (primary) hypertension: Secondary | ICD-10-CM | POA: Diagnosis not present

## 2024-02-12 DIAGNOSIS — Z7982 Long term (current) use of aspirin: Secondary | ICD-10-CM | POA: Insufficient documentation

## 2024-02-12 DIAGNOSIS — R7303 Prediabetes: Secondary | ICD-10-CM

## 2024-02-12 DIAGNOSIS — Z79899 Other long term (current) drug therapy: Secondary | ICD-10-CM | POA: Diagnosis not present

## 2024-02-12 DIAGNOSIS — Z96651 Presence of right artificial knee joint: Secondary | ICD-10-CM

## 2024-02-12 DIAGNOSIS — M25561 Pain in right knee: Secondary | ICD-10-CM | POA: Diagnosis present

## 2024-02-12 HISTORY — PX: KNEE ARTHROPLASTY: SHX992

## 2024-02-12 SURGERY — ARTHROPLASTY, KNEE, TOTAL, USING IMAGELESS COMPUTER-ASSISTED NAVIGATION
Anesthesia: Spinal | Site: Knee | Laterality: Right

## 2024-02-12 MED ORDER — KETAMINE HCL 50 MG/5ML IJ SOSY
PREFILLED_SYRINGE | INTRAMUSCULAR | Status: AC
  Filled 2024-02-12: qty 5

## 2024-02-12 MED ORDER — PHENOL 1.4 % MT LIQD: 1.0000 | OROMUCOSAL | Status: DC | PRN

## 2024-02-12 MED ORDER — PHENYLEPHRINE HCL-NACL 20-0.9 MG/250ML-% IV SOLN
INTRAVENOUS | Status: AC
  Filled 2024-02-12: qty 250

## 2024-02-12 MED ORDER — MIDAZOLAM HCL 5 MG/5ML IJ SOLN
INTRAMUSCULAR | Status: DC | PRN
  Administered 2024-02-12: 2 mg via INTRAVENOUS

## 2024-02-12 MED ORDER — MAGNESIUM OXIDE -MG SUPPLEMENT 400 (240 MG) MG PO TABS
200.0000 mg | ORAL_TABLET | Freq: Every day | ORAL | Status: DC
  Administered 2024-02-12: 200 mg via ORAL
  Filled 2024-02-12: qty 1

## 2024-02-12 MED ORDER — OXYCODONE HCL 5 MG PO TABS
5.0000 mg | ORAL_TABLET | ORAL | Status: DC | PRN
  Administered 2024-02-12: 5 mg via ORAL
  Filled 2024-02-12: qty 1

## 2024-02-12 MED ORDER — ACETAMINOPHEN 10 MG/ML IV SOLN
INTRAVENOUS | Status: DC | PRN
  Administered 2024-02-12: 1000 mg via INTRAVENOUS

## 2024-02-12 MED ORDER — TRAMADOL HCL 50 MG PO TABS: 50.0000 mg | ORAL_TABLET | ORAL | Status: DC | PRN

## 2024-02-12 MED ORDER — ONDANSETRON HCL 4 MG PO TABS: 4.0000 mg | ORAL_TABLET | Freq: Four times a day (QID) | ORAL | Status: DC | PRN

## 2024-02-12 MED ORDER — CEFAZOLIN SODIUM-DEXTROSE 2-4 GM/100ML-% IV SOLN
INTRAVENOUS | Status: AC
Start: 2024-02-12 — End: 2024-02-12
  Filled 2024-02-12: qty 100

## 2024-02-12 MED ORDER — GABAPENTIN 300 MG PO CAPS
ORAL_CAPSULE | ORAL | Status: AC
Start: 2024-02-12 — End: 2024-02-12
  Filled 2024-02-12: qty 1

## 2024-02-12 MED ORDER — SODIUM CHLORIDE 0.9 % IR SOLN
Status: DC | PRN
  Administered 2024-02-12: 3000 mL

## 2024-02-12 MED ORDER — TRANEXAMIC ACID-NACL 1000-0.7 MG/100ML-% IV SOLN
INTRAVENOUS | Status: AC
Start: 2024-02-12 — End: 2024-02-12
  Filled 2024-02-12: qty 100

## 2024-02-12 MED ORDER — HYDROMORPHONE HCL 1 MG/ML IJ SOLN: 0.5000 mg | INTRAMUSCULAR | Status: DC | PRN

## 2024-02-12 MED ORDER — FLEET ENEMA RE ENEM: 1.0000 | ENEMA | Freq: Once | RECTAL | Status: DC | PRN

## 2024-02-12 MED ORDER — METOCLOPRAMIDE HCL 10 MG PO TABS
10.0000 mg | ORAL_TABLET | Freq: Three times a day (TID) | ORAL | Status: DC
  Administered 2024-02-12 – 2024-02-13 (×3): 10 mg via ORAL
  Filled 2024-02-12 (×3): qty 1

## 2024-02-12 MED ORDER — TRANEXAMIC ACID-NACL 1000-0.7 MG/100ML-% IV SOLN
1000.0000 mg | Freq: Once | INTRAVENOUS | Status: AC
  Administered 2024-02-12: 1000 mg via INTRAVENOUS

## 2024-02-12 MED ORDER — ALUM & MAG HYDROXIDE-SIMETH 200-200-20 MG/5ML PO SUSP: 30.0000 mL | ORAL | Status: DC | PRN

## 2024-02-12 MED ORDER — ORAL CARE MOUTH RINSE: 15.0000 mL | OROMUCOSAL | Status: DC | PRN

## 2024-02-12 MED ORDER — CHLORHEXIDINE GLUCONATE 0.12 % MT SOLN
OROMUCOSAL | Status: AC
Start: 2024-02-12 — End: 2024-02-12
  Filled 2024-02-12: qty 15

## 2024-02-12 MED ORDER — ACETAMINOPHEN 10 MG/ML IV SOLN
1000.0000 mg | Freq: Four times a day (QID) | INTRAVENOUS | Status: DC
  Administered 2024-02-12 – 2024-02-13 (×3): 1000 mg via INTRAVENOUS
  Filled 2024-02-12 (×3): qty 100

## 2024-02-12 MED ORDER — ENSURE PRE-SURGERY PO LIQD
296.0000 mL | Freq: Once | ORAL | Status: AC
  Administered 2024-02-12: 296 mL via ORAL
  Filled 2024-02-12: qty 296

## 2024-02-12 MED ORDER — DEXAMETHASONE SODIUM PHOSPHATE 10 MG/ML IJ SOLN
INTRAMUSCULAR | Status: AC
  Filled 2024-02-12: qty 1

## 2024-02-12 MED ORDER — PROPOFOL 1000 MG/100ML IV EMUL
INTRAVENOUS | Status: AC
  Filled 2024-02-12: qty 200

## 2024-02-12 MED ORDER — ASPIRIN 81 MG PO CHEW
81.0000 mg | CHEWABLE_TABLET | Freq: Two times a day (BID) | ORAL | Status: DC
  Administered 2024-02-12 – 2024-02-13 (×2): 81 mg via ORAL
  Filled 2024-02-12 (×2): qty 1

## 2024-02-12 MED ORDER — CELECOXIB 200 MG PO CAPS
400.0000 mg | ORAL_CAPSULE | Freq: Once | ORAL | Status: AC
  Administered 2024-02-12: 400 mg via ORAL

## 2024-02-12 MED ORDER — DIPHENHYDRAMINE HCL 12.5 MG/5ML PO ELIX: 12.5000 mg | ORAL_SOLUTION | ORAL | Status: DC | PRN

## 2024-02-12 MED ORDER — SENNOSIDES-DOCUSATE SODIUM 8.6-50 MG PO TABS
1.0000 | ORAL_TABLET | Freq: Two times a day (BID) | ORAL | Status: DC
Start: 2024-02-12 — End: 2024-02-13
  Administered 2024-02-12 – 2024-02-13 (×2): 1 via ORAL
  Filled 2024-02-12 (×2): qty 1

## 2024-02-12 MED ORDER — ROSUVASTATIN CALCIUM 20 MG PO TABS
10.0000 mg | ORAL_TABLET | Freq: Every day | ORAL | Status: DC
  Administered 2024-02-12: 10 mg via ORAL
  Filled 2024-02-12: qty 1

## 2024-02-12 MED ORDER — BISACODYL 10 MG RE SUPP: 10.0000 mg | Freq: Every day | RECTAL | Status: DC | PRN

## 2024-02-12 MED ORDER — SURGIPHOR WOUND IRRIGATION SYSTEM - OPTIME
TOPICAL | Status: DC | PRN
  Administered 2024-02-12: 450 mL via TOPICAL

## 2024-02-12 MED ORDER — FERROUS SULFATE 325 (65 FE) MG PO TABS
325.0000 mg | ORAL_TABLET | Freq: Two times a day (BID) | ORAL | Status: DC
  Administered 2024-02-12 – 2024-02-13 (×2): 325 mg via ORAL
  Filled 2024-02-12 (×2): qty 1

## 2024-02-12 MED ORDER — PANTOPRAZOLE SODIUM 40 MG PO TBEC
40.0000 mg | DELAYED_RELEASE_TABLET | Freq: Two times a day (BID) | ORAL | Status: DC
  Administered 2024-02-12 – 2024-02-13 (×3): 40 mg via ORAL
  Filled 2024-02-12 (×3): qty 1

## 2024-02-12 MED ORDER — BUPIVACAINE HCL (PF) 0.25 % IJ SOLN
INTRAMUSCULAR | Status: DC | PRN
  Administered 2024-02-12: 60 mL

## 2024-02-12 MED ORDER — OXYCODONE HCL 5 MG PO TABS: 10.0000 mg | ORAL_TABLET | ORAL | Status: DC | PRN

## 2024-02-12 MED ORDER — LEVOTHYROXINE SODIUM 175 MCG PO TABS
175.0000 ug | ORAL_TABLET | Freq: Every day | ORAL | Status: DC
  Administered 2024-02-13: 175 ug via ORAL
  Filled 2024-02-12: qty 1

## 2024-02-12 MED ORDER — MENTHOL 3 MG MT LOZG: 1.0000 | LOZENGE | OROMUCOSAL | Status: DC | PRN

## 2024-02-12 MED ORDER — SODIUM CHLORIDE 0.9 % IV SOLN
INTRAVENOUS | Status: DC
Start: 2024-02-12 — End: 2024-02-13

## 2024-02-12 MED ORDER — MAGNESIUM HYDROXIDE 400 MG/5ML PO SUSP
30.0000 mL | Freq: Every day | ORAL | Status: DC
  Administered 2024-02-13: 30 mL via ORAL
  Filled 2024-02-12: qty 30

## 2024-02-12 MED ORDER — LISINOPRIL 20 MG PO TABS: 20.0000 mg | ORAL_TABLET | Freq: Every day | ORAL | Status: DC

## 2024-02-12 MED ORDER — MIDAZOLAM HCL 2 MG/2ML IJ SOLN
INTRAMUSCULAR | Status: AC
  Filled 2024-02-12: qty 2

## 2024-02-12 MED ORDER — CELECOXIB 200 MG PO CAPS
200.0000 mg | ORAL_CAPSULE | Freq: Two times a day (BID) | ORAL | Status: DC
  Administered 2024-02-12 – 2024-02-13 (×2): 200 mg via ORAL
  Filled 2024-02-12 (×2): qty 1

## 2024-02-12 MED ORDER — PROPOFOL 500 MG/50ML IV EMUL
INTRAVENOUS | Status: DC | PRN
  Administered 2024-02-12: 75 ug/kg/min via INTRAVENOUS
  Administered 2024-02-12: 20 mg via INTRAVENOUS

## 2024-02-12 MED ORDER — TRANEXAMIC ACID-NACL 1000-0.7 MG/100ML-% IV SOLN
INTRAVENOUS | Status: AC
  Filled 2024-02-12: qty 100

## 2024-02-12 MED ORDER — CELECOXIB 200 MG PO CAPS
ORAL_CAPSULE | ORAL | Status: AC
  Filled 2024-02-12: qty 2

## 2024-02-12 MED ORDER — FENTANYL CITRATE (PF) 100 MCG/2ML IJ SOLN
INTRAMUSCULAR | Status: DC | PRN
  Administered 2024-02-12: 50 ug via INTRAVENOUS

## 2024-02-12 MED ORDER — SODIUM CHLORIDE 0.9 % IV SOLN
INTRAVENOUS | Status: DC | PRN
  Administered 2024-02-12: 60 mL

## 2024-02-12 MED ORDER — BUPIVACAINE HCL (PF) 0.5 % IJ SOLN
INTRAMUSCULAR | Status: DC | PRN
  Administered 2024-02-12: 3 mL

## 2024-02-12 MED ORDER — ACETAMINOPHEN 325 MG PO TABS: 325.0000 mg | ORAL_TABLET | Freq: Four times a day (QID) | ORAL | Status: DC | PRN

## 2024-02-12 MED ORDER — ONDANSETRON HCL 4 MG/2ML IJ SOLN: 4.0000 mg | Freq: Four times a day (QID) | INTRAMUSCULAR | Status: DC | PRN

## 2024-02-12 MED ORDER — FENTANYL CITRATE (PF) 100 MCG/2ML IJ SOLN
INTRAMUSCULAR | Status: AC
  Filled 2024-02-12: qty 2

## 2024-02-12 MED ORDER — CEFAZOLIN SODIUM-DEXTROSE 2-4 GM/100ML-% IV SOLN
2.0000 g | Freq: Four times a day (QID) | INTRAVENOUS | Status: AC
  Administered 2024-02-12 (×2): 2 g via INTRAVENOUS
  Filled 2024-02-12 (×2): qty 100

## 2024-02-12 MED ORDER — BUPIVACAINE HCL (PF) 0.5 % IJ SOLN
INTRAMUSCULAR | Status: AC
  Filled 2024-02-12: qty 10

## 2024-02-12 SURGICAL SUPPLY — 64 items
ATTUNE MED DOME PAT 41 KNEE (Knees) IMPLANT
ATTUNE PS FEM RT SZ9 CEM KNEE (Femur) IMPLANT
BASE TIBIAL ATTUNE KNEE SZ9 (Knees) IMPLANT
BATTERY INSTRU NAVIGATION (MISCELLANEOUS) ×4 IMPLANT
BIT DRILL QUICK REL 1/8 2PK SL (BIT) ×1 IMPLANT
BLADE CLIPPER SURG (BLADE) IMPLANT
BLADE SAW 70X12.5 (BLADE) ×1 IMPLANT
BLADE SAW 90X13X1.19 OSCILLAT (BLADE) ×1 IMPLANT
BLADE SAW 90X25X1.19 OSCILLAT (BLADE) ×1 IMPLANT
BRUSH SCRUB EZ PLAIN DRY (MISCELLANEOUS) ×1 IMPLANT
CEMENT BONE GENTAMICIN 40 (Cement) IMPLANT
COOLER ICEMAN CLASSIC (MISCELLANEOUS) ×1 IMPLANT
CUFF TRNQT CYL 30X4X21-28X (TOURNIQUET CUFF) IMPLANT
DRAPE SHEET LG 3/4 BI-LAMINATE (DRAPES) ×1 IMPLANT
DRSG AQUACEL AG ADV 3.5X14 (GAUZE/BANDAGES/DRESSINGS) ×1 IMPLANT
DRSG MEPILEX SACRM 8.7X9.8 (GAUZE/BANDAGES/DRESSINGS) ×1 IMPLANT
DRSG OPSITE POSTOP 4X14 (GAUZE/BANDAGES/DRESSINGS) IMPLANT
DRSG TEGADERM 4X4.75 (GAUZE/BANDAGES/DRESSINGS) ×1 IMPLANT
DRSG XEROFORM 1X8 (GAUZE/BANDAGES/DRESSINGS) IMPLANT
DURAPREP 26ML APPLICATOR (WOUND CARE) ×2 IMPLANT
ELECT CAUTERY BLADE 6.4 (BLADE) ×1 IMPLANT
ELECTRODE REM PT RTRN 9FT ADLT (ELECTROSURGICAL) ×1 IMPLANT
EVACUATOR 1/8 PVC DRAIN (DRAIN) ×1 IMPLANT
EX-PIN ORTHOLOCK NAV 4X150 (PIN) ×2 IMPLANT
GAUZE XEROFORM 1X8 LF (GAUZE/BANDAGES/DRESSINGS) ×1 IMPLANT
GLOVE BIOGEL M STRL SZ7.5 (GLOVE) ×6 IMPLANT
GLOVE BIOGEL PI IND STRL 8 (GLOVE) ×1 IMPLANT
GLOVE SRG 8 PF TXTR STRL LF DI (GLOVE) ×1 IMPLANT
GOWN STRL REUS W/ TWL LRG LVL3 (GOWN DISPOSABLE) ×1 IMPLANT
GOWN STRL REUS W/ TWL XL LVL3 (GOWN DISPOSABLE) ×1 IMPLANT
GOWN TOGA ZIPPER T7+ PEEL AWAY (MISCELLANEOUS) ×1 IMPLANT
HOLDER FOLEY CATH W/STRAP (MISCELLANEOUS) ×1 IMPLANT
HOOD PEEL AWAY T7 (MISCELLANEOUS) ×1 IMPLANT
INSERT TIBIAL ATTUNE SZ9 5MM (Insert) IMPLANT
KIT TURNOVER KIT A (KITS) ×1 IMPLANT
KNIFE SCULPS 14X20 (INSTRUMENTS) ×1 IMPLANT
MANIFOLD NEPTUNE II (INSTRUMENTS) ×2 IMPLANT
NDL SPNL 20GX3.5 QUINCKE YW (NEEDLE) ×2 IMPLANT
NEEDLE SPNL 20GX3.5 QUINCKE YW (NEEDLE) ×2 IMPLANT
PACK TOTAL KNEE (MISCELLANEOUS) ×1 IMPLANT
PAD ABD DERMACEA PRESS 5X9 (GAUZE/BANDAGES/DRESSINGS) ×2 IMPLANT
PAD ARMBOARD POSITIONER FOAM (MISCELLANEOUS) ×3 IMPLANT
PAD COLD UNI WRAP-ON (PAD) ×1 IMPLANT
PENCIL SMOKE EVACUATOR COATED (MISCELLANEOUS) ×1 IMPLANT
PIN DRILL FIX HALF THREAD (BIT) ×2 IMPLANT
PIN FIXATION 1/8DIA X 3INL (PIN) ×1 IMPLANT
SOL .9 NS 3000ML IRR UROMATIC (IV SOLUTION) ×1 IMPLANT
SOLUTION IRRIG SURGIPHOR (IV SOLUTION) ×1 IMPLANT
SPONGE DRAIN TRACH 4X4 STRL 2S (GAUZE/BANDAGES/DRESSINGS) ×1 IMPLANT
STAPLER SKIN PROX 35W (STAPLE) ×1 IMPLANT
STOCKINETTE IMPERV 14X48 (MISCELLANEOUS) ×1 IMPLANT
STOCKINETTE STRL BIAS CUT 8X4 (MISCELLANEOUS) ×1 IMPLANT
STRAP TIBIA SHORT (MISCELLANEOUS) ×1 IMPLANT
SUCTION TUBE FRAZIER 10FR DISP (SUCTIONS) ×1 IMPLANT
SUT VIC AB 0 CT1 36 (SUTURE) ×1 IMPLANT
SUT VIC AB 1 CT1 36 (SUTURE) ×2 IMPLANT
SUT VIC AB 2-0 CT2 27 (SUTURE) ×1 IMPLANT
SYR 30ML LL (SYRINGE) ×2 IMPLANT
TIP FAN IRRIG PULSAVAC PLUS (DISPOSABLE) ×1 IMPLANT
TOWEL OR 17X26 4PK STRL BLUE (TOWEL DISPOSABLE) IMPLANT
TOWER CARTRIDGE SMART MIX (DISPOSABLE) ×1 IMPLANT
TRAP FLUID SMOKE EVACUATOR (MISCELLANEOUS) ×1 IMPLANT
TRAY FOLEY MTR SLVR 16FR STAT (SET/KITS/TRAYS/PACK) ×1 IMPLANT
WATER STERILE IRR 1000ML POUR (IV SOLUTION) ×1 IMPLANT

## 2024-02-12 NOTE — Discharge Summary (Signed)
 Physician Discharge Summary  Subjective: 1 Day Post-Op Procedure(s) (LRB): ARTHROPLASTY, KNEE, TOTAL, USING IMAGELESS COMPUTER-ASSISTED NAVIGATION (Right) Patient reports pain as mild.   Patient seen in rounds with Dr. Mardee. Patient is well, and has had no acute complaints or problems Denies any CP, SOB, N/V, fevers or chills We will start therapy today.  Patient is ready to go home  Physician Discharge Summary  Patient ID: Andrew Grimes MRN: [id number] DOB/AGE: 28-Oct-1955 68 y.o.  Admit date: 02/12/2024 Discharge date: 02/13/2024  Admission Diagnoses:  Discharge Diagnoses:  Principal Problem:   History of total knee arthroplasty, right   Discharged Condition: good  Hospital Course: Patient presented to the hospital on 02/12/2024 for an elective right total knee arthroplasty performed by Dr. Mardee. Patient was given 1g of TXA and 3g of Ancef  prior to the procedure. he  tolerated the procedure well without any complications. See procedural note below for details. Postoperatively, the patient did very well. he  was able to pass PT protocols on post-op day one without any issues. JP drain was removed without any difficulty and was intact. he  was able to void his bladder without any difficulty. Physical exam was unremarkable. he  denies any SOB, CP, N/V, fevers or chills. Vital signs are stable. Patient is stable to discharge home.  PROCEDURE:  Right total knee arthroplasty using computer-assisted navigation   SURGEON:  Lynwood SHAUNNA Mardee Mickey. M.D.   ASSISTANT:  Sidra Koyanagi, PA-C (present and scrubbed throughout the case, critical for assistance with exposure, retraction, instrumentation, and closure)   ANESTHESIA: spinal   ESTIMATED BLOOD LOSS: 50 mL   FLUIDS REPLACED: 900 mL of crystalloid   TOURNIQUET TIME: 97 minutes   DRAINS: 2 medium Hemovac drains   SOFT TISSUE RELEASES: Anterior cruciate ligament, posterior cruciate ligament, deep medial collateral ligament,  patellofemoral ligament   IMPLANTS UTILIZED: DePuy Attune size 9 posterior stabilized femoral component (cemented), size 9 rotating platform tibial component (cemented), 41 mm medialized dome patella (cemented), and a 5 mm stabilized rotating platform polyethylene insert.  Treatments: none  Discharge Exam: Blood pressure 115/69, pulse 65, temperature 97.9 F (36.6 C), temperature source Temporal, resp. rate 16, height 6' 1 (1.854 m), weight 127.9 kg, SpO2 96%.   Disposition: home   Allergies as of 02/13/2024       Reactions   Codeine Nausea Only        Medication List     TAKE these medications    aspirin  81 MG chewable tablet Chew 1 tablet (81 mg total) by mouth 2 (two) times daily.   B-12 1000 MCG/ML Kit Inject 1,000 mcg into the muscle every 30 (thirty) days.   B-complex with vitamin C tablet Take 1 tablet by mouth daily.   celecoxib  200 MG capsule Commonly known as: CELEBREX  Take 1 capsule (200 mg total) by mouth 2 (two) times daily.   Fish Oil 1200 MG Cpdr Take 1,200 mg by mouth daily.   ketoconazole 2 % cream Commonly known as: NIZORAL Apply 1 Application topically 2 (two) times daily.   levothyroxine  175 MCG tablet Commonly known as: SYNTHROID  Take 175 mcg by mouth daily before breakfast.   lisinopril  20 MG tablet Commonly known as: ZESTRIL  Take 20 mg by mouth daily.   Magnesium  250 MG Tabs Take 1 tablet by mouth at bedtime.   oxyCODONE  5 MG immediate release tablet Commonly known as: Oxy IR/ROXICODONE  Take 1 tablet (5 mg total) by mouth every 4 (four) hours as needed for moderate pain (pain  score 4-6) (pain score 4-6).   pantoprazole  40 MG tablet Commonly known as: PROTONIX  Take 40 mg by mouth daily.   rosuvastatin  10 MG tablet Commonly known as: CRESTOR  Take 10 mg by mouth at bedtime.   traMADol  50 MG tablet Commonly known as: ULTRAM  Take 1-2 tablets (50-100 mg total) by mouth every 4 (four) hours as needed for moderate pain (pain score  4-6).   TURMERIC-GINGER PO Take 1 capsule by mouth 2 (two) times daily.   Vitamin D (Ergocalciferol) 50 MCG (2000 UT) Caps Take 2,000 Units by mouth daily.               Durable Medical Equipment  (From admission, onward)           Start     Ordered   02/12/24 1228  DME Walker rolling  Once       Question:  Patient needs a walker to treat with the following condition  Answer:  Total knee replacement status   02/12/24 1227   02/12/24 1228  DME Bedside commode  Once       Comments: Patient is not able to walk the distance required to go the bathroom, or he/she is unable to safely negotiate stairs required to access the bathroom.  A 3in1 BSC will alleviate this problem  Question:  Patient needs a bedside commode to treat with the following condition  Answer:  Total knee replacement status   02/12/24 1227            Follow-up Information     Drake Chew, PA-C Follow up on 02/27/2024.   Specialty: Orthopedic Surgery Why: at 1:15pm Contact information: [address] [place] [address] [phone]         Mardee Lynwood SQUIBB, MD Follow up on 03/26/2024.   Specialty: Orthopedic Surgery Why: at 10:45am Contact information: [address] [facility] [place] [address] [phone]                 Signed: Sidra Drake 02/13/2024, 8:27 AM   Objective: Vital signs in last 24 hours: Temp:  [97 F (36.1 C)-98.9 F (37.2 C)] 97.9 F (36.6 C) (09/16 0458) Pulse Rate:  [51-73] 65 (09/16 0458) Resp:  [11-19] 16 (09/16 0458) BP: (112-147)/(61-97) 115/69 (09/16 0458) SpO2:  [95 %-99 %] 96 % (09/16 0458) Weight:  [127.9 kg] 127.9 kg (09/15 1139)  Intake/Output from previous day:  Intake/Output Summary (Last 24 hours) at 02/13/2024 0827 Last data filed at 02/13/2024 0500 Gross per 24 hour  Intake 2778.16 ml  Output 1545 ml  Net 1233.16 ml    Intake/Output this shift: No intake/output data recorded.  Labs: No  results for input(s): HGB in the last 72 hours. No results for input(s): WBC, RBC, HCT, PLT in the last 72 hours. No results for input(s): NA, K, CL, CO2, BUN, CREATININE, GLUCOSE, CALCIUM  in the last 72 hours. No results for input(s): LABPT, INR in the last 72 hours.  EXAM: General - Patient is Alert, Appropriate, and Oriented Extremity - Neurologically intact Neurovascular intact Sensation intact distally Intact pulses distally Dorsiflexion/Plantar flexion intact No cellulitis present Compartment soft Dressing - dressing C/D/I and no drainage Motor Function - intact, moving foot and toes well on exam. JP Drain pulled without difficulty. Intact  Assessment/Plan: 1 Day Post-Op Procedure(s) (LRB): ARTHROPLASTY, KNEE, TOTAL, USING IMAGELESS COMPUTER-ASSISTED NAVIGATION (Right) Procedure(s) (LRB): ARTHROPLASTY, KNEE, TOTAL, USING IMAGELESS COMPUTER-ASSISTED NAVIGATION (Right) Past Medical History:  Diagnosis Date   GERD (gastroesophageal reflux  disease)    History of kidney stones    Hyperlipidemia    Hypertension    Pre-diabetes    Principal Problem:   History of total knee arthroplasty, right  Estimated body mass index is 37.21 kg/m as calculated from the following:   Height as of this encounter: 6' 1 (1.854 m).   Weight as of this encounter: 127.9 kg.  Patient will continue to work with physical therapy to pass postoperative PT protocols, ROM and strengthening   Discussed with the patient continuing to utilize Polar Care   Patient will use bone foam in 20-30 minute intervals   Patient will wear TED hose bilaterally to help prevent DVT and clot formation   Discussed the Aquacel bandage.  This bandage will stay in place 7 days postoperatively.  Can be replaced with honeycomb bandages that will be sent home with the patient   Discussed sending the patient home with tramadol  and oxycodone  for as needed pain management.  Patient will also be  sent home with Celebrex  to help with swelling and inflammation.  Patient will take an 81 mg aspirin  twice daily for DVT prophylaxis   JP drain removed without difficulty, intact   Weight-Bearing as tolerated to right leg   Patient will follow-up with [facility] orthoped[facility] in 2 weeks for staple removal and reevaluation  Diet - Regular diet Follow up - in 2 weeks Activity - WBAT Disposition - Home Condition Upon Discharge - Good DVT Prophylaxis - Aspirin  and TED hose  Fonda CHARLENA Koyanagi, PA-C Orthopaedic Surgery 02/13/2024, 8:27 AM

## 2024-02-12 NOTE — Op Note (Signed)
 OPERATIVE NOTE  DATE OF SURGERY:  02/12/2024  PATIENT NAME:  Andrew Grimes   DOB: 12/14/1955  MRN: [id number]  PRE-OPERATIVE DIAGNOSIS: Degenerative arthrosis of the right knee, primary  POST-OPERATIVE DIAGNOSIS:  Same  PROCEDURE:  Right total knee arthroplasty using computer-assisted navigation  SURGEON:  Lynwood SHAUNNA Mardee Mickey. M.D.  ASSISTANT:  Sidra Koyanagi, PA-C (present and scrubbed throughout the case, critical for assistance with exposure, retraction, instrumentation, and closure)  ANESTHESIA: spinal  ESTIMATED BLOOD LOSS: 50 mL  FLUIDS REPLACED: 900 mL of crystalloid  TOURNIQUET TIME: 97 minutes  DRAINS: 2 medium Hemovac drains  SOFT TISSUE RELEASES: Anterior cruciate ligament, posterior cruciate ligament, deep medial collateral ligament, patellofemoral ligament  IMPLANTS UTILIZED: DePuy Attune size 9 posterior stabilized femoral component (cemented), size 9 rotating platform tibial component (cemented), 41 mm medialized dome patella (cemented), and a 5 mm stabilized rotating platform polyethylene insert.  INDICATIONS FOR SURGERY: Andrew Grimes is a 68 y.o. year old male with a long history of progressive knee pain. X-rays demonstrated severe degenerative changes in tricompartmental fashion. The patient had not seen any significant improvement despite conservative nonsurgical intervention. After discussion of the risks and benefits of surgical intervention, the patient expressed understanding of the risks benefits and agree with plans for total knee arthroplasty.   The risks, benefits, and alternatives were discussed at length including but not limited to the risks of infection, bleeding, nerve injury, stiffness, blood clots, the need for revision surgery, cardiopulmonary complications, among others, and they were willing to proceed.  PROCEDURE IN DETAIL: The patient was brought into the operating room and, after adequate spinal anesthesia was achieved, a tourniquet was  placed on the patient's upper thigh. The patient's knee and leg were cleaned and prepped with alcohol and DuraPrep and draped in the usual sterile fashion. A timeout was performed as per usual protocol. The lower extremity was exsanguinated using an Esmarch, and the tourniquet was inflated to 300 mmHg. An anterior longitudinal incision was made followed by a standard mid vastus approach. The deep fibers of the medial collateral ligament were elevated in a subperiosteal fashion off of the medial flare of the tibia so as to maintain a continuous soft tissue sleeve. The patella was subluxed laterally and the patellofemoral ligament was incised. Inspection of the knee demonstrated severe degenerative changes with full-thickness loss of articular cartilage. Osteophytes were debrided using a rongeur. Anterior and posterior cruciate ligaments were excised. Two 4.0 mm Schanz pins were inserted in the femur and into the tibia for attachment of the array of trackers used for computer-assisted navigation. Hip center was identified using a circumduction technique. Distal landmarks were mapped using the computer. The distal femur and proximal tibia were mapped using the computer. The distal femoral cutting guide was positioned using computer-assisted navigation so as to achieve a 5 distal valgus cut. The femur was sized and it was felt that a size 9 femoral component was appropriate. A size 9 femoral cutting guide was positioned and the anterior cut was performed and verified using the computer. This was followed by completion of the posterior and chamfer cuts. Femoral cutting guide for the central box was then positioned in the center box cut was performed.  Attention was then directed to the proximal tibia. Medial and lateral menisci were excised. The extramedullary tibial cutting guide was positioned using computer-assisted navigation so as to achieve a 0 varus-valgus alignment and 3 posterior slope. The cut was  performed and verified using the computer. The proximal tibia  was sized and it was felt that a size 9 tibial tray was appropriate. Tibial and femoral trials were inserted followed by insertion of a 5 mm polyethylene insert. This allowed for excellent mediolateral soft tissue balancing both in flexion and in full extension. Finally, the patella was cut and prepared so as to accommodate a 41 mm medialized dome patella. A patella trial was placed and the knee was placed through a range of motion with excellent patellar tracking appreciated. The femoral trial was removed after debridement of posterior osteophytes. The central post-hole for the tibial component was reamed followed by insertion of a keel punch. Tibial trials were then removed. Cut surfaces of bone were irrigated with copious amounts of normal saline using pulsatile lavage and then suctioned dry. Polymethylmethacrylate cement with gentamicin was prepared in the usual fashion using a vacuum mixer. Cement was applied to the cut surface of the proximal tibia as well as along the undersurface of a size 9 rotating platform tibial component. Tibial component was positioned and impacted into place. Excess cement was removed using Personal assistant. Cement was then applied to the cut surfaces of the femur as well as along the posterior flanges of the size 9 femoral component. The femoral component was positioned and impacted into place. Excess cement was removed using Personal assistant. A 5 mm polyethylene trial was inserted and the knee was brought into full extension with steady axial compression applied. Finally, cement was applied to the backside of a 41 mm medialized dome patella and the patellar component was positioned and patellar clamp applied. Excess cement was removed using Personal assistant. After adequate curing of the cement, the tourniquet was deflated after a total tourniquet time of 97 minutes. Hemostasis was achieved using electrocautery. The knee was  irrigated with copious amounts of normal saline using pulsatile lavage followed by 450 ml of Surgiphor and then suctioned dry. 20 mL of 1.3% Exparel  and 60 mL of 0.25% Marcaine  in 40 mL of normal saline was injected along the posterior capsule, medial and lateral gutters, and along the arthrotomy site. A 5 mm stabilized rotating platform polyethylene insert was inserted and the knee was placed through a range of motion with excellent mediolateral soft tissue balancing appreciated and excellent patellar tracking noted. 2 medium drains were placed in the wound bed and brought out through separate stab incisions. The medial parapatellar portion of the incision was reapproximated using interrupted sutures of #1 Vicryl. Subcutaneous tissue was approximated in layers using first #0 Vicryl followed #2-0 Vicryl. The skin was approximated with skin staples. A sterile dressing was applied.  The patient tolerated the procedure well and was transported to the recovery room in stable condition.    Doloras Tellado P. Shauntay Brunelli, Jr., M.D.

## 2024-02-12 NOTE — Transfer of Care (Signed)
 Immediate Anesthesia Transfer of Care Note  Patient: Andrew Grimes  Procedure(s) Performed: ARTHROPLASTY, KNEE, TOTAL, USING IMAGELESS COMPUTER-ASSISTED NAVIGATION (Right: Knee)  Patient Location: PACU  Anesthesia Type:MAC and Spinal  Level of Consciousness: awake and alert   Airway & Oxygen Therapy: Patient Spontanous Breathing and Patient connected to face mask oxygen  Post-op Assessment: Report given to RN and Post -op Vital signs reviewed and stable  Post vital signs: stable  Last Vitals:  Vitals Value Taken Time  BP 126/90 02/12/24 11:01  Temp    Pulse 69 02/12/24 11:04  Resp 13 02/12/24 11:04  SpO2 94 % 02/12/24 11:04  Vitals shown include unfiled device data.  Last Pain:  Vitals:   02/12/24 0649  PainSc: 0-No pain      Patients Stated Pain Goal: 0 (02/12/24 0649)  Complications: No notable events documented.

## 2024-02-12 NOTE — Plan of Care (Signed)

## 2024-02-12 NOTE — Progress Notes (Signed)
 Subjective: 1 Day Post-Op Procedure(s) (LRB): ARTHROPLASTY, KNEE, TOTAL, USING IMAGELESS COMPUTER-ASSISTED NAVIGATION (Right) Patient reports pain as mild.   Patient seen in rounds with Dr. Mardee. Patient is well, and has had no acute complaints or problems Denies any CP, SOB, N/V, fevers or chills We will start therapy today.  Plan is to go Home after hospital stay.  Objective: Vital signs in last 24 hours: Temp:  [97 F (36.1 C)-98.9 F (37.2 C)] 97.9 F (36.6 C) (09/16 0458) Pulse Rate:  [51-73] 65 (09/16 0458) Resp:  [11-19] 16 (09/16 0458) BP: (112-147)/(61-97) 115/69 (09/16 0458) SpO2:  [95 %-99 %] 96 % (09/16 0458) Weight:  [127.9 kg] 127.9 kg (09/15 1139)  Intake/Output from previous day:  Intake/Output Summary (Last 24 hours) at 02/13/2024 0825 Last data filed at 02/13/2024 0500 Gross per 24 hour  Intake 2778.16 ml  Output 1545 ml  Net 1233.16 ml    Intake/Output this shift: No intake/output data recorded.  Labs: No results for input(s): HGB in the last 72 hours. No results for input(s): WBC, RBC, HCT, PLT in the last 72 hours. No results for input(s): NA, K, CL, CO2, BUN, CREATININE, GLUCOSE, CALCIUM  in the last 72 hours. No results for input(s): LABPT, INR in the last 72 hours.  EXAM General - Patient is Alert, Appropriate, and Oriented Extremity - Neurologically intact Neurovascular intact Sensation intact distally Intact pulses distally Dorsiflexion/Plantar flexion intact No cellulitis present Compartment soft Dressing - dressing C/D/I and no drainage Motor Function - intact, moving foot and toes well on exam. JP Drain pulled without difficulty. Intact  Past Medical History:  Diagnosis Date   GERD (gastroesophageal reflux disease)    History of kidney stones    Hyperlipidemia    Hypertension    Pre-diabetes     Assessment/Plan: 1 Day Post-Op Procedure(s) (LRB): ARTHROPLASTY, KNEE, TOTAL, USING IMAGELESS  COMPUTER-ASSISTED NAVIGATION (Right) Principal Problem:   History of total knee arthroplasty, right  Estimated body mass index is 37.21 kg/m as calculated from the following:   Height as of this encounter: 6' 1 (1.854 m).   Weight as of this encounter: 127.9 kg. Advance diet Up with therapy  Patient will continue to work with physical therapy to pass postoperative PT protocols, ROM and strengthening  Discussed with the patient continuing to utilize Polar Care  Patient will use bone foam in 20-30 minute intervals  Patient will wear TED hose bilaterally to help prevent DVT and clot formation  Discussed the Aquacel bandage.  This bandage will stay in place 7 days postoperatively.  Can be replaced with honeycomb bandages that will be sent home with the patient  Discussed sending the patient home with tramadol  and oxycodone  for as needed pain management.  Patient will also be sent home with Celebrex  to help with swelling and inflammation.  Patient will take an 81 mg aspirin  twice daily for DVT prophylaxis  JP drain removed without difficulty, intact  Weight-Bearing as tolerated to right leg  Patient will follow-up with [facility] orthopedics in 2 weeks for staple removal and reevaluation  Fonda Koyanagi, PA-C [facility] 02/13/2024, 8:25 AM

## 2024-02-12 NOTE — Evaluation (Signed)
 Physical Therapy Evaluation Patient Details Name: Andrew Grimes MRN: [id number] DOB: 04/12/1956 Today's Date: 02/12/2024  History of Present Illness  Andrew Grimes is a 68 y.o. male who has severe right knee pain for about 5 yeras. Pt is POD 0 at time of evaluation for R total knee arthroplasty.  Clinical Impression  Pt is a pleasant 68 year old male who is POD 0 on day of evaluation for R TKA. Prior to hospitalization, pt was independent with ambulation and ADLs. Pt able to perform SLR and assist himself out of bed with min A for LE management. Pt is able to perform transfers and ambulation with CGA. Pt requires min A for STS from toilet for eccentric phase to sit d/t low toilet height. Pt unable to comfortably sit on BSC provided in room. Pt demonstrates ambulation with CGA; SPT provided verbal cues for sequencing and to promote weight bearing through RW. Pt reports mild pain with extension while ambulating, but did not demonstrate buckling or LOB. Pt's knee ROM is 12-72 degrees, however the measurement was limited by bandaging. Pt demonstrates deficits with strength/ROM/balance. Would benefit from skilled PT to address above deficits and promote optimal return to PLOF. Pt left seated on toilet with reporting need for BM. Nursing notified.           If plan is discharge home, recommend the following: A little help with walking and/or transfers;A little help with bathing/dressing/bathroom;Help with stairs or ramp for entrance;Assist for transportation   Can travel by private vehicle        Equipment Recommendations Rolling walker (2 wheels);BSC/3in1  Recommendations for Other Services       Functional Status Assessment Patient has had a recent decline in their functional status and demonstrates the ability to make significant improvements in function in a reasonable and predictable amount of time.     Precautions / Restrictions Precautions Precautions: Fall Recall of  Precautions/Restrictions: Intact Restrictions Weight Bearing Restrictions Per Provider Order: Yes RLE Weight Bearing Per Provider Order: Weight bearing as tolerated      Mobility  Bed Mobility Overal bed mobility: Needs Assistance Bed Mobility: Supine to Sit     Supine to sit: Min assist     General bed mobility comments: Min A for LE management.    Transfers Overall transfer level: Needs assistance Equipment used: Rolling walker (2 wheels) Transfers: Sit to/from Stand, Bed to chair/wheelchair/BSC Sit to Stand: Min assist Stand pivot transfers: Contact guard assist         General transfer comment: CGA for safety d/t pt being impulsive. Pt able to stand from toilet with use of grab bars. Verbal cues for hand placement.    Ambulation/Gait Ambulation/Gait assistance: Contact guard assist Gait Distance (Feet): 100 Feet Assistive device: Rolling walker (2 wheels) Gait Pattern/deviations: Step-through pattern, Decreased weight shift to right       General Gait Details: Pt has tendency to take large step with RLE. Verbal cues for sequencing and to promote weight bearing through RW. Pt also has tendency to pick RW up despite mulitple cues/reminders to push the RW.  Stairs            Wheelchair Mobility     Tilt Bed    Modified Rankin (Stroke Patients Only)       Balance Overall balance assessment: Needs assistance Sitting-balance support: Feet supported Sitting balance-Leahy Scale: Normal Sitting balance - Comments: Able to maintain seated balance on EOB and on toilet.   Standing balance support: Bilateral upper  extremity supported, During functional activity, Reliant on assistive device for balance Standing balance-Leahy Scale: Fair Standing balance comment: No LOB while standing with RW. Verbal cues to promote weight bearing throught RW to avoid increase in pain. Pt reports inc pain with knee extension.                              Pertinent Vitals/Pain Pain Assessment Pain Assessment: No/denies pain    Home Living Family/patient expects to be discharged to:: Private residence Living Arrangements: Spouse/significant other Available Help at Discharge: Family Type of Home: House Home Access: Stairs to enter Entrance Stairs-Rails: None Entrance Stairs-Number of Steps: 3   Home Layout: One level Home Equipment: Crutches Additional Comments: Pt's spouse reports having a raised toilet height. Pt has walk-in shower and tub shower in their home    Prior Function Prior Level of Function : Independent/Modified Independent;Driving             Mobility Comments: Independent. No use of AD for mobility. ADLs Comments: Independent     Extremity/Trunk Assessment   Upper Extremity Assessment Upper Extremity Assessment: Overall WFL for tasks assessed    Lower Extremity Assessment Lower Extremity Assessment: Overall WFL for tasks assessed;RLE deficits/detail RLE Deficits / Details: Surgical side. RLE wrapped upon entry. Required nursing to re-wrap/tape d/t bandaging coming off after getting OOB.    Cervical / Trunk Assessment Cervical / Trunk Assessment: Normal  Communication   Communication Communication: No apparent difficulties    Cognition Arousal: Alert Behavior During Therapy: WFL for tasks assessed/performed, Impulsive   PT - Cognitive impairments: No apparent impairments                       PT - Cognition Comments: Pleasant and agreeable to PT session. Pt is impulsive and wants to move quickly. Following commands: Intact       Cueing Cueing Techniques: Verbal cues, Tactile cues     General Comments      Exercises Total Joint Exercises Goniometric ROM: 12-72 (limited by bandaging)   Assessment/Plan    PT Assessment Patient needs continued PT services  PT Problem List Decreased strength;Decreased range of motion;Decreased balance;Decreased mobility;Decreased activity  tolerance;Decreased knowledge of use of DME;Decreased safety awareness       PT Treatment Interventions DME instruction;Gait training;Stair training;Functional mobility training;Therapeutic activities;Therapeutic exercise;Balance training;Neuromuscular re-education;Patient/family education    PT Goals (Current goals can be found in the Care Plan section)  Acute Rehab PT Goals Patient Stated Goal: to go home PT Goal Formulation: With patient Time For Goal Achievement: 02/26/24 Potential to Achieve Goals: Good    Frequency BID     Co-evaluation               AM-PAC PT 6 Clicks Mobility  Outcome Measure Help needed turning from your back to your side while in a flat bed without using bedrails?: None Help needed moving from lying on your back to sitting on the side of a flat bed without using bedrails?: A Little Help needed moving to and from a bed to a chair (including a wheelchair)?: A Little Help needed standing up from a chair using your arms (e.g., wheelchair or bedside chair)?: A Little Help needed to walk in hospital room?: A Little Help needed climbing 3-5 steps with a railing? : A Little 6 Click Score: 19    End of Session Equipment Utilized During Treatment: Gait belt Activity Tolerance: Patient  tolerated treatment well Patient left: with call bell/phone within reach;with family/visitor present (Pt left on toilet reporting need for BM. Nursing notified) Nurse Communication: Mobility status PT Visit Diagnosis: Muscle weakness (generalized) (M62.81);Difficulty in walking, not elsewhere classified (R26.2)    Time: 8479-8452 PT Time Calculation (min) (ACUTE ONLY): 27 min   Charges:   PT Evaluation $PT Eval Low Complexity: 1 Low PT Treatments $Gait Training: 8-22 mins PT General Charges $$ ACUTE PT VISIT: 1 Visit         Luvenia Avers, SPT   Andrew Grimes 02/12/2024, 4:34 PM

## 2024-02-12 NOTE — Anesthesia Preprocedure Evaluation (Signed)
 Anesthesia Evaluation  Patient identified by MRN, date of birth, ID band Patient awake    Reviewed: Allergy & Precautions, NPO status , Patient's Chart, lab work & pertinent test results  History of Anesthesia Complications Negative for: history of anesthetic complications  Airway Mallampati: III  TM Distance: <3 FB Neck ROM: full    Dental  (+) Chipped   Pulmonary neg pulmonary ROS, neg shortness of breath   Pulmonary exam normal        Cardiovascular Exercise Tolerance: Good hypertension, (-) angina Normal cardiovascular exam     Neuro/Psych negative neurological ROS  negative psych ROS   GI/Hepatic Neg liver ROS,GERD  Controlled,,  Endo/Other  negative endocrine ROS    Renal/GU      Musculoskeletal   Abdominal   Peds  Hematology negative hematology ROS (+)   Anesthesia Other Findings Past Medical History: No date: GERD (gastroesophageal reflux disease) No date: History of kidney stones No date: Hyperlipidemia No date: Hypertension No date: Pre-diabetes  Past Surgical History: No date: CATARACT EXTRACTION; Bilateral 2016: COLONOSCOPY 1992: FOOT FRACTURE SURGERY; Left     Comment:  crushed left foot 1977: FOOT SURGERY; Left     Comment:  neuroma No date: THYROIDECTOMY No date: TONSILLECTOMY     Comment:  child  BMI    Body Mass Index: 37.21 kg/m      Reproductive/Obstetrics negative OB ROS                              Anesthesia Physical Anesthesia Plan  ASA: 3  Anesthesia Plan: Spinal   Post-op Pain Management:    Induction:   PONV Risk Score and Plan:   Airway Management Planned: Natural Airway and Nasal Cannula  Additional Equipment:   Intra-op Plan:   Post-operative Plan:   Informed Consent: I have reviewed the patients History and Physical, chart, labs and discussed the procedure including the risks, benefits and alternatives for the proposed  anesthesia with the patient or authorized representative who has indicated his/her understanding and acceptance.     Dental Advisory Given  Plan Discussed with: Anesthesiologist, CRNA and Surgeon  Anesthesia Plan Comments: (Patient reports no bleeding problems and no anticoagulant use.  Plan for spinal with backup GA  Patient consented for risks of anesthesia including but not limited to:  - adverse reactions to medications - damage to eyes, teeth, lips or other oral mucosa - nerve damage due to positioning  - risk of bleeding, infection and or nerve damage from spinal that could lead to paralysis - risk of headache or failed spinal - damage to teeth, lips or other oral mucosa - sore throat or hoarseness - damage to heart, brain, nerves, lungs, other parts of body or loss of life  Patient voiced understanding and assent.)        Anesthesia Quick Evaluation

## 2024-02-12 NOTE — Progress Notes (Signed)
 Patient is not able to walk the distance required to go the bathroom, or he/she is unable to safely negotiate stairs required to access the bathroom.  A 3in1 BSC will alleviate this problem   Amenda Duclos P. Angie Fava M.D.

## 2024-02-12 NOTE — Anesthesia Procedure Notes (Signed)
 Spinal  Patient location during procedure: OR Start time: 02/12/2024 7:15 AM End time: 02/12/2024 7:19 AM Reason for block: surgical anesthesia Staffing Performed: resident/CRNA  Resident/CRNA: Gillermo Spruce I, CRNA Performed by: Gillermo Spruce I, CRNA Authorized by: Gillermo Spruce I, CRNA   Preanesthetic Checklist Completed: patient identified, IV checked, site marked, risks and benefits discussed, surgical consent, monitors and equipment checked, pre-op evaluation and timeout performed Spinal Block Patient position: sitting Prep: DuraPrep Patient monitoring: heart rate, cardiac monitor, continuous pulse ox and blood pressure Approach: midline Location: L3-4 Injection technique: single-shot Needle Needle type: Sprotte  Needle gauge: 24 G Needle length: 9 cm Needle insertion depth: 8 cm Assessment Sensory level: T4 Events: CSF return

## 2024-02-12 NOTE — Care Management Obs Status (Signed)
 MEDICARE OBSERVATION STATUS NOTIFICATION   Patient Details  Name: Andrew Grimes MRN: [id number] Date of Birth: 12/04/1955   Medicare Observation Status Notification Given:  Yes    Rojelio [name] 02/12/2024, 3:34 PM

## 2024-02-12 NOTE — Interval H&P Note (Signed)
 History and Physical Interval Note:  02/12/2024 6:22 AM  Andrew Grimes  has presented today for surgery, with the diagnosis of Primary osteoarthritis of right knee.  The various methods of treatment have been discussed with the patient and family. After consideration of risks, benefits and other options for treatment, the patient has consented to  Procedure(s): ARTHROPLASTY, KNEE, TOTAL, USING IMAGELESS COMPUTER-ASSISTED NAVIGATION (Right) as a surgical intervention.  The patient's history has been reviewed, patient examined, no change in status, stable for surgery.  I have reviewed the patient's chart and labs.  Questions were answered to the patient's satisfaction.     Samantha Ragen P Khamauri Bauernfeind

## 2024-02-13 ENCOUNTER — Encounter: Payer: Self-pay | Admitting: Orthopedic Surgery

## 2024-02-13 DIAGNOSIS — M1711 Unilateral primary osteoarthritis, right knee: Secondary | ICD-10-CM | POA: Diagnosis not present

## 2024-02-13 MED ORDER — CELECOXIB 200 MG PO CAPS: 200.0000 mg | ORAL_CAPSULE | Freq: Two times a day (BID) | ORAL | 1 refills | Status: DC

## 2024-02-13 MED ORDER — OXYCODONE HCL 5 MG PO TABS: 5.0000 mg | ORAL_TABLET | ORAL | 0 refills | Status: DC | PRN

## 2024-02-13 MED ORDER — TRAMADOL HCL 50 MG PO TABS: 50.0000 mg | ORAL_TABLET | ORAL | 0 refills | Status: DC | PRN

## 2024-02-13 MED ORDER — ASPIRIN 81 MG PO CHEW: 81.0000 mg | CHEWABLE_TABLET | Freq: Two times a day (BID) | ORAL | Status: AC

## 2024-02-13 NOTE — Anesthesia Postprocedure Evaluation (Signed)
 Anesthesia Post Note  Patient: Andrew Grimes  Procedure(s) Performed: ARTHROPLASTY, KNEE, TOTAL, USING IMAGELESS COMPUTER-ASSISTED NAVIGATION (Right: Knee)  Patient location during evaluation: Nursing Unit Anesthesia Type: Spinal Level of consciousness: oriented and awake and alert Pain management: pain level controlled Vital Signs Assessment: post-procedure vital signs reviewed and stable Respiratory status: spontaneous breathing and respiratory function stable Cardiovascular status: blood pressure returned to baseline and stable Postop Assessment: no headache, no backache, no apparent nausea or vomiting and patient able to bend at knees Anesthetic complications: no   No notable events documented.   Last Vitals:  Vitals:   02/12/24 2331 02/13/24 0458  BP: 130/64 115/69  Pulse: 72 65  Resp: 15 16  Temp: 37.2 C 36.6 C  SpO2: 96% 96%    Last Pain:  Vitals:   02/13/24 0500  TempSrc:   PainSc: 2                  Andrew Grimes

## 2024-02-13 NOTE — Evaluation (Signed)
 Occupational Therapy Evaluation Patient Details Name: Andrew Grimes MRN: [id number] DOB: [birth year] Today's Date: 02/13/2024   History of Present Illness   Andrew Grimes is a 68 y.o. male s/p R total knee arthroplasty on 02/12/24     Clinical Impressions Pt seen for OT evaluation this date, POD#1 from above surgery. Pt was independent in all ADLs & mobility prior to surgery, including driving. Pt is eager to return to PLOF with less pain and improved safety and independence. Pt currently requires setup assist for LB dressing while in seated position due to pain and limited AROM of R knee. Pt instructed in polar care mgt, falls prevention strategies, home/routines modifications, DME/AE for LB bathing and dressing tasks, and compression stocking mgt. Pt completes t/f bathroom using RW for toilet transfer training, benefits from verbal cues for hand placement and BLE positioning for improved technique. Pt would benefit from skilled OT services including additional instruction in dressing techniques with or without assistive devices for dressing and bathing skills to support recall and carryover prior to discharge and ultimately to maximize safety, independence, and minimize falls risk and caregiver burden. Do not currently anticipate any OT needs following this hospitalization.       If plan is discharge home, recommend the following:   A little help with walking and/or transfers;A little help with bathing/dressing/bathroom;Assist for transportation;Help with stairs or ramp for entrance     Functional Status Assessment   Patient has had a recent decline in their functional status and demonstrates the ability to make significant improvements in function in a reasonable and predictable amount of time.     Equipment Recommendations   BSC/3in1;Tub/shower seat      Precautions/Restrictions   Precautions Precautions: Fall Recall of Precautions/Restrictions:  Intact Restrictions Weight Bearing Restrictions Per Provider Order: Yes RLE Weight Bearing Per Provider Order: Weight bearing as tolerated     Mobility Bed Mobility Overal bed mobility: Modified Independent             General bed mobility comments: NT, pt recieved and left in recliner '    Transfers Overall transfer level: Needs assistance Equipment used: Rolling walker (2 wheels) Transfers: Sit to/from Stand Sit to Stand: Supervision           General transfer comment: supervision level for multiple STS transfers from varied surface heights, min vcs for safety awareness      Balance Overall balance assessment: Needs assistance Sitting-balance support: Feet supported Sitting balance-Leahy Scale: Normal     Standing balance support: Bilateral upper extremity supported, During functional activity, Reliant on assistive device for balance Standing balance-Leahy Scale: Good                             ADL either performed or assessed with clinical judgement   ADL Overall ADL's : Needs assistance/impaired                     Lower Body Dressing: Supervision/safety;Sit to/from stand;Cueing for sequencing;Cueing for safety;Maximal assistance Lower Body Dressing Details (indicate cue type and reason): donned R TED hose with edu on compensatory strategies, pt able to don R sock over TED with setup, dons shoes with setup Toilet Transfer: Supervision/safety;Ambulation;Rolling walker (2 wheels);Cueing for safety;Cueing for sequencing;Regular Toilet;Grab bars Toilet Transfer Details (indicate cue type and reason): t/f bathroom using RW, cues for hand placement and RLE positioning Toileting- Clothing Manipulation and Hygiene: Supervision/safety;Sit to/from stand;Cueing for safety;Cueing for sequencing  Functional mobility during ADLs: Supervision/safety;Cueing for safety;Cueing for sequencing;Rolling walker (2 wheels) General ADL Comments: t/f  bathroom using RW, cues for AD proximity      Pertinent Vitals/Pain Pain Assessment Pain Assessment: 0-10 Pain Score: 1  Pain Location: R knee Pain Descriptors / Indicators: Discomfort Pain Intervention(s): Limited activity within patient's tolerance, Monitored during session, Repositioned     Extremity/Trunk Assessment Upper Extremity Assessment Upper Extremity Assessment: Right hand dominant;Overall [facility] for tasks assessed   Lower Extremity Assessment Lower Extremity Assessment: Defer to PT evaluation   Cervical / Trunk Assessment Cervical / Trunk Assessment: Normal   Communication Communication Communication: No apparent difficulties   Cognition Arousal: Alert Behavior During Therapy: WFL for tasks assessed/performed, Impulsive Cognition: No apparent impairments             OT - Cognition Comments: decreased safety awareness                 Following commands: Intact       Cueing  General Comments   Cueing Techniques: Verbal cues;Tactile cues              Home Living Family/patient expects to be discharged to:: Private residence Living Arrangements: Spouse/significant other Available Help at Discharge: Family Type of Home: House Home Access: Stairs to enter Secretary/administrator of Steps: 3 Entrance Stairs-Rails: None Home Layout: One level     Bathroom Shower/Tub: Producer, television/film/video: Standard Bathroom Accessibility: Yes How Accessible: Accessible via walker Home Equipment: Crutches   Additional Comments: Pt's spouse reports having a raised toilet height. Pt has walk-in shower and tub shower in their home      Prior Functioning/Environment Prior Level of Function : Independent/Modified Independent;Driving             Mobility Comments: Independent. No use of AD for mobility. ADLs Comments: Independent    OT Problem List: Decreased strength;Decreased range of motion;Decreased activity tolerance;Impaired balance  (sitting and/or standing);Decreased safety awareness;Increased edema;Decreased knowledge of use of DME or AE;Decreased knowledge of precautions   OT Treatment/Interventions: Self-care/ADL training;Therapeutic exercise;Energy conservation;DME and/or AE instruction;Patient/family education;Balance training;Therapeutic activities      OT Goals(Current goals can be found in the care plan section)   Acute Rehab OT Goals OT Goal Formulation: With patient/family Time For Goal Achievement: 02/27/24 Potential to Achieve Goals: Good   OT Frequency:  Min 1X/week       AM-PAC OT 6 Clicks Daily Activity     Outcome Measure Help from another person eating meals?: None Help from another person taking care of personal grooming?: None Help from another person toileting, which includes using toliet, bedpan, or urinal?: A Little Help from another person bathing (including washing, rinsing, drying)?: A Little Help from another person to put on and taking off regular upper body clothing?: A Little Help from another person to put on and taking off regular lower body clothing?: A Little 6 Click Score: 20   End of Session Equipment Utilized During Treatment: Rolling walker (2 wheels) Nurse Communication: Mobility status  Activity Tolerance: Patient tolerated treatment well Patient left: in chair;with call bell/phone within reach;with family/visitor present  OT Visit Diagnosis: Unsteadiness on feet (R26.81);Other abnormalities of gait and mobility (R26.89)                Time: 9098-9078 OT Time Calculation (min): 20 min Charges:  OT General Charges $OT Visit: 1 Visit OT Evaluation $OT Eval Low Complexity: 1 Low OT Treatments $Self Care/Home Management : 8-22 mins  Andrew Grimes, OTR/L  02/13/24, 10:10 AM

## 2024-02-13 NOTE — Plan of Care (Signed)
  Problem: Clinical Measurements: Goal: Respiratory complications will improve Outcome: Progressing   Problem: Activity: Goal: Risk for activity intolerance will decrease Outcome: Progressing   Problem: Coping: Goal: Level of anxiety will decrease Outcome: Progressing   Problem: Elimination: Goal: Will not experience complications related to urinary retention Outcome: Progressing   Problem: Pain Managment: Goal: General experience of comfort will improve and/or be controlled Outcome: Progressing

## 2024-02-13 NOTE — Plan of Care (Signed)
  Problem: Activity: Goal: Risk for activity intolerance will decrease Outcome: Progressing   Problem: Pain Managment: Goal: General experience of comfort will improve and/or be controlled Outcome: Progressing

## 2024-02-13 NOTE — Progress Notes (Signed)
 DISCHARGE NOTE:  Pt given discharge instructions, scripts and 2 honeycomb dressings. TED hose on both legs. BSC and walker sent with pt. Pt wheeled to car by staff, wife providing transportation.

## 2024-02-13 NOTE — Progress Notes (Signed)
 Physical Therapy Treatment Patient Details Name: Andrew Grimes MRN: [id number] DOB: [birth year] Today's Date: 02/13/2024   History of Present Illness Andrew Grimes is a 68 y.o. male who has severe right knee pain for about 5 yeras. Pt is POD 0 at time of evaluation for R total knee arthroplasty.    PT Comments  Pt was supine in bed with HOB elevated ~ 20 degrees and RLE resting in bone foam. Pt tolerating knee extension well. Pt is A and O x 4. Supportive spouse present throughout.  Pt easily able to exit bed, stand to RW and tolerate ambulation > 200 ft total. Pt demonstrated abilities to ascend/descend stairs to simulate home entry with use of RW and spouses assistance. Pt is a little impulsive and requires vcing throughout session for slowing down for additional safety. Pt is not at his baseline and will benefit from continued skilled PT at DC to maximize independence and safety with all ADLs. Overall, pt tolerated session well and is progressing well towards all PT goals.    If plan is discharge home, recommend the following: A little help with walking and/or transfers;A little help with bathing/dressing/bathroom;Help with stairs or ramp for entrance;Assist for transportation     Equipment Recommendations  Rolling walker (2 wheels);Other (comment)       Precautions / Restrictions Precautions Precautions: Fall Recall of Precautions/Restrictions: Intact Restrictions Weight Bearing Restrictions Per Provider Order: Yes RLE Weight Bearing Per Provider Order: Weight bearing as tolerated     Mobility  Bed Mobility Overal bed mobility: Modified Independent Bed Mobility: Supine to Sit  Supine to sit: Supervision  General bed mobility comments: no physical assistance required    Transfers Overall transfer level: Needs assistance Equipment used: Rolling walker (2 wheels) Transfers: Sit to/from Stand Sit to Stand: Supervision  General transfer comment: no physical assistance to stand  form lowest bed height    Ambulation/Gait Ambulation/Gait assistance: Supervision Gait Distance (Feet): 200 Feet Assistive device: Rolling walker (2 wheels) Gait Pattern/deviations: Step-through pattern Gait velocity: too fast. constant vcs for slowing down  General Gait Details: Pt was able to ambulate ~ 200 ft with RW. No LOB or safety concern.    Balance Overall balance assessment: Needs assistance Sitting-balance support: Feet supported Sitting balance-Leahy Scale: Normal     Standing balance support: Bilateral upper extremity supported, During functional activity, Reliant on assistive device for balance Standing balance-Leahy Scale: Good      Communication Communication Communication: No apparent difficulties  Cognition Arousal: Alert Behavior During Therapy: WFL for tasks assessed/performed, Impulsive   PT - Cognitive impairments: No apparent impairments    PT - Cognition Comments: Pleasant and agreeable to PT session. remains slightly impulsive. vcing throughout session to slow down Following commands: Intact      Cueing Cueing Techniques: Verbal cues, Tactile cues  Exercises Total Joint Exercises Goniometric ROM: 2-89        Pertinent Vitals/Pain Pain Assessment Pain Assessment: 0-10 Pain Score: 7  Pain Location: R knee Pain Descriptors / Indicators: Discomfort Pain Intervention(s): Limited activity within patient's tolerance, Monitored during session, Premedicated before session, Repositioned, Ice applied     PT Goals (current goals can now be found in the care plan section) Acute Rehab PT Goals Patient Stated Goal: to go home Progress towards PT goals: Progressing toward goals    Frequency    BID       AM-PAC PT 6 Clicks Mobility   Outcome Measure  Help needed turning from your back to your side  while in a flat bed without using bedrails?: None Help needed moving from lying on your back to sitting on the side of a flat bed without using  bedrails?: A Little Help needed moving to and from a bed to a chair (including a wheelchair)?: A Little Help needed standing up from a chair using your arms (e.g., wheelchair or bedside chair)?: A Little Help needed to walk in hospital room?: A Little Help needed climbing 3-5 steps with a railing? : A Little 6 Click Score: 19    End of Session Equipment Utilized During Treatment: Gait belt Activity Tolerance: Patient tolerated treatment well Patient left: in chair;with call bell/phone within reach;with chair alarm set;with family/visitor present Nurse Communication: Mobility status PT Visit Diagnosis: Muscle weakness (generalized) (M62.81);Difficulty in walking, not elsewhere classified (R26.2)     Time: 9263-9198 PT Time Calculation (min) (ACUTE ONLY): 25 min  Charges:    $Gait Training: 8-22 mins $Therapeutic Exercise: 8-22 mins PT General Charges $$ ACUTE PT VISIT: 1 Visit                     Rankin Essex PTA 02/13/24, 8:23 AM

## 2024-03-26 ENCOUNTER — Ambulatory Visit (INDEPENDENT_AMBULATORY_CARE_PROVIDER_SITE_OTHER): Payer: Self-pay | Admitting: Internal Medicine

## 2024-03-26 VITALS — BP 118/74 | HR 86 | Temp 97.5°F | Ht 73.0 in | Wt 287.4 lb

## 2024-03-26 DIAGNOSIS — Z125 Encounter for screening for malignant neoplasm of prostate: Secondary | ICD-10-CM

## 2024-03-26 DIAGNOSIS — Z23 Encounter for immunization: Secondary | ICD-10-CM | POA: Diagnosis not present

## 2024-03-26 DIAGNOSIS — R7989 Other specified abnormal findings of blood chemistry: Secondary | ICD-10-CM | POA: Diagnosis not present

## 2024-03-26 DIAGNOSIS — R7303 Prediabetes: Secondary | ICD-10-CM

## 2024-03-26 DIAGNOSIS — K219 Gastro-esophageal reflux disease without esophagitis: Secondary | ICD-10-CM

## 2024-03-26 DIAGNOSIS — E785 Hyperlipidemia, unspecified: Secondary | ICD-10-CM | POA: Diagnosis not present

## 2024-03-26 DIAGNOSIS — E042 Nontoxic multinodular goiter: Secondary | ICD-10-CM

## 2024-03-26 DIAGNOSIS — Z96651 Presence of right artificial knee joint: Secondary | ICD-10-CM

## 2024-03-26 DIAGNOSIS — I10 Essential (primary) hypertension: Secondary | ICD-10-CM | POA: Diagnosis not present

## 2024-03-26 NOTE — Progress Notes (Addendum)
 Subjective:    Patient ID: Andrew Grimes, male    DOB: 1955-11-08, 68 y.o.   MRN: [id number]  Patient here for  Chief Complaint  Patient presents with   Establish Care    HPI Here to establish care. S/p knee surgery 02/12/24 - right total knee arthroplasty (Dr Mardee). Going to PT. Has a history of hypertension, hypercholesterolemia, hypothyroidism s/p thyroidectomy (for goiter) and pre diabetes. Reports increased fatigue. Does snore. Wants to take a nap - evening. Discussed possible sleep apnea and further w/up. Is currently on protonix . Controls symptoms. If he misses, symptoms will return. Request to change from 40mg  to 20mg  q day. Has had EGD and is s/p dilatation. Has had issues with dysphagia. Also has a history of colon polyps.  Discussed sugars. States A1c usually between 5.6 - 6.1. no chest pain or sob reported. No abdominal pain or bowel change reported. Regarding family history - mother with stomach/colon cancer - 71s and parkinsons. PGF - CVA was an alcoholic. PGM - bone cancer. MGB - CVA.    Past Medical History:  Diagnosis Date   GERD (gastroesophageal reflux disease)    History of kidney stones    Hyperlipidemia    Hypertension    Pre-diabetes    Past Surgical History:  Procedure Laterality Date   CATARACT EXTRACTION Bilateral    COLONOSCOPY  2016   FOOT FRACTURE SURGERY Left 1992   crushed left foot   FOOT SURGERY Left 1977   neuroma   KNEE ARTHROPLASTY Right 02/12/2024   Procedure: ARTHROPLASTY, KNEE, TOTAL, USING IMAGELESS COMPUTER-ASSISTED NAVIGATION;  Surgeon: Mardee Lynwood SQUIBB, MD;  Location: [facility] ORS;  Service: Orthopedics;  Laterality: Right;   THYROIDECTOMY     TONSILLECTOMY     child   History reviewed. No pertinent family history. Social History   Socioeconomic History   Marital status: Married    Spouse name: Not on file   Number of children: Not on file   Years of education: Not on file   Highest education level: 12th grade  Occupational  History   Not on file  Tobacco Use   Smoking status: Never   Smokeless tobacco: Never  Vaping Use   Vaping status: Never Used  Substance and Sexual Activity   Alcohol use: Not Currently   Drug use: Not Currently   Sexual activity: Not Currently  Other Topics Concern   Not on file  Social History Narrative   Not on file   Social Drivers of Health   Financial Resource Strain: Low Risk  (03/27/2024)   Received from [facility]   Overall Financial Resource Strain (CARDIA)    Difficulty of Paying Living Expenses: Not hard at all  Food Insecurity: No Food Insecurity (03/27/2024)   Received from [facility]   Hunger Vital Sign    Within the past 12 months, you worried that your food would run out before you got the money to buy more.: Never true    Within the past 12 months, the food you bought just didn't last and you didn't have money to get more.: Never true  Transportation Needs: No Transportation Needs (03/27/2024)   Received from [facility] - Transportation    In the past 12 months, has lack of transportation kept you from medical appointments or from getting medications?: No    Lack of Transportation (Non-Medical): No  Physical Activity: Not on file  Stress: Not on file  Social Connections:  Socially Integrated (03/25/2024)   Social Connection and Isolation Panel    Frequency of Communication with Friends and Family: More than three times a week    Frequency of Social Gatherings with Friends and Family: More than three times a week    Attends Religious Services: More than 4 times per year    Active Member of [employer] or Organizations: Yes    Attends Engineer, Structural: More than 4 times per year    Marital Status: Married     Review of Systems  Constitutional:  Negative for appetite change and unexpected weight change.  HENT:  Negative for congestion and sinus pressure.   Respiratory:  Negative for  cough, chest tightness and shortness of breath.   Cardiovascular:  Negative for chest pain, palpitations and leg swelling.  Gastrointestinal:  Negative for abdominal pain, diarrhea, nausea and vomiting.  Genitourinary:  Negative for difficulty urinating and dysuria.  Musculoskeletal:  Negative for joint swelling and myalgias.  Skin:  Negative for color change and rash.  Neurological:  Negative for dizziness and headaches.  Psychiatric/Behavioral:  Negative for agitation and dysphoric mood.        Objective:     BP 118/74   Pulse 86   Temp (!) 97.5 F (36.4 C) (Oral)   Ht 6' 1 (1.854 m)   Wt 287 lb 6.4 oz (130.4 kg)   SpO2 96%   BMI 37.92 kg/m  Wt Readings from Last 3 Encounters:  03/26/24 287 lb 6.4 oz (130.4 kg)  02/12/24 282 lb (127.9 kg)  02/02/24 282 lb (127.9 kg)    Physical Exam Vitals reviewed.  Constitutional:      General: He is not in acute distress.    Appearance: Normal appearance. He is well-developed.  HENT:     Head: Normocephalic and atraumatic.     Right Ear: External ear normal.     Left Ear: External ear normal.     Mouth/Throat:     Pharynx: No oropharyngeal exudate or posterior oropharyngeal erythema.  Eyes:     General: No scleral icterus.       Right eye: No discharge.        Left eye: No discharge.     Conjunctiva/sclera: Conjunctivae normal.  Cardiovascular:     Rate and Rhythm: Normal rate and regular rhythm.  Pulmonary:     Effort: Pulmonary effort is normal. No respiratory distress.     Breath sounds: Normal breath sounds.  Abdominal:     General: Bowel sounds are normal.     Palpations: Abdomen is soft.     Tenderness: There is no abdominal tenderness.  Musculoskeletal:        General: No swelling or tenderness.     Cervical back: Neck supple. No tenderness.  Lymphadenopathy:     Cervical: No cervical adenopathy.  Skin:    Findings: No erythema or rash.  Neurological:     Mental Status: He is alert.  Psychiatric:         Mood and Affect: Mood normal.        Behavior: Behavior normal.         Outpatient Encounter Medications as of 03/26/2024  Medication Sig   Cyanocobalamin (B-12) 1000 MCG/ML KIT Inject 1,000 mcg into the muscle every 30 (thirty) days.   levothyroxine  (SYNTHROID ) 175 MCG tablet Take 175 mcg by mouth daily before breakfast.   lisinopril  (ZESTRIL ) 20 MG tablet Take 20 mg by mouth daily.   pantoprazole  (PROTONIX ) 20 MG tablet  Take 1 tablet (20 mg total) by mouth daily.   rosuvastatin  (CRESTOR ) 10 MG tablet Take 10 mg by mouth at bedtime.   [DISCONTINUED] pantoprazole  (PROTONIX ) 40 MG tablet Take 40 mg by mouth daily.   aspirin  81 MG chewable tablet Chew 1 tablet (81 mg total) by mouth 2 (two) times daily.   [DISCONTINUED] B Complex-C (B-COMPLEX WITH VITAMIN C) tablet Take 1 tablet by mouth daily.   [DISCONTINUED] celecoxib  (CELEBREX ) 200 MG capsule Take 1 capsule (200 mg total) by mouth 2 (two) times daily.   [DISCONTINUED] ketoconazole (NIZORAL) 2 % cream Apply 1 Application topically 2 (two) times daily.   [DISCONTINUED] Magnesium  250 MG TABS Take 1 tablet by mouth at bedtime.   [DISCONTINUED] Omega-3 Fatty Acids (FISH OIL) 1200 MG CPDR Take 1,200 mg by mouth daily.    [DISCONTINUED] oxyCODONE  (OXY IR/ROXICODONE ) 5 MG immediate release tablet Take 1 tablet (5 mg total) by mouth every 4 (four) hours as needed for moderate pain (pain score 4-6) (pain score 4-6).   [DISCONTINUED] traMADol  (ULTRAM ) 50 MG tablet Take 1-2 tablets (50-100 mg total) by mouth every 4 (four) hours as needed for moderate pain (pain score 4-6).   [DISCONTINUED] TURMERIC-GINGER PO Take 1 capsule by mouth 2 (two) times daily.   [DISCONTINUED] Vitamin D, Ergocalciferol, 50 MCG (2000 UT) CAPS Take 2,000 Units by mouth daily.   No facility-administered encounter medications on file as of 03/26/2024.     Lab Results  Component Value Date   WBC 6.7 02/02/2024   HGB 16.1 02/02/2024   HCT 47.6 02/02/2024   PLT 346  02/02/2024   GLUCOSE 102 (H) 02/02/2024   ALT 46 (H) 02/02/2024   AST 35 02/02/2024   NA 135 02/02/2024   K 4.1 02/02/2024   CL 104 02/02/2024   CREATININE 1.13 02/02/2024   BUN 19 02/02/2024   CO2 23 02/02/2024   HGBA1C 6.1 (H) 02/02/2024    DG Knee Right Port Result Date: 02/12/2024 CLINICAL DATA:  Status post total knee replacement. EXAM: PORTABLE RIGHT KNEE - 1-2 VIEW COMPARISON:  None Available. FINDINGS: Status post right total knee arthroplasty with normal alignment. No acute fracture. Expected postoperative soft tissue swelling, soft tissue air, cutaneous staples, and drain anteriorly. IMPRESSION: Status post right total knee arthroplasty with normal alignment. Electronically Signed   By: Harrietta Sherry M.D.   On: 02/12/2024 11:17       Assessment & Plan:  Essential hypertension Assessment & Plan: Continues on lisinopril  20mg  q day. Blood pressure as outlined. No changes in medication. Check metabolic panel.   Orders: -     Vitamin B12; Future -     Basic metabolic panel with GFR; Future  Need for influenza vaccination -     Flu vaccine HIGH DOSE PF(Fluzone Trivalent)  Hyperlipidemia, unspecified hyperlipidemia type Assessment & Plan: Continues on crestor . Low cholesterol diet and exercise. Follow lipid panel.   Orders: -     Lipid panel; Future -     Hepatic function panel; Future  Abnormal liver function test Assessment & Plan: ALT increased on recent check. Recheck liver panel with fasting labs.    Severe obesity (BMI 35.0-39.9) with comorbidity (HCC) Assessment & Plan: Continue diet and exercise.    Gastroesophageal reflux disease without esophagitis Assessment & Plan: Symptoms controlled on protonix  40mg  q day. Desires to decrease dose. Will decrease to 20mg  q day. Follow.    History of total knee arthroplasty, right Assessment & Plan: Followed by Dr Mardee. PT. Doing well.  Multinodular goiter Assessment & Plan: S/p thyroidectomy. On  levothyroxine . Check tsh.   Orders: -     TSH; Future -     Vitamin B12; Future  Prediabetes Assessment & Plan: Low carb diet and exercise. Follow met b and A1c.    Prostate cancer screening -     PSA, Medicare; Future  Other orders -     Pantoprazole  Sodium; Take 1 tablet (20 mg total) by mouth daily.  Dispense: 90 tablet; Refill: 1     Allena Hamilton, MD

## 2024-03-29 ENCOUNTER — Telehealth: Payer: Self-pay | Admitting: Internal Medicine

## 2024-03-29 DIAGNOSIS — R7989 Other specified abnormal findings of blood chemistry: Secondary | ICD-10-CM | POA: Insufficient documentation

## 2024-03-29 NOTE — Telephone Encounter (Signed)
 Patient need lab orders.

## 2024-03-29 NOTE — Telephone Encounter (Signed)
 Labs are pended from 10/28 OV. Waiting for provider to sign them

## 2024-03-31 ENCOUNTER — Encounter: Payer: Self-pay | Admitting: Internal Medicine

## 2024-03-31 MED ORDER — PANTOPRAZOLE SODIUM 20 MG PO TBEC: 20.0000 mg | DELAYED_RELEASE_TABLET | Freq: Every day | ORAL | 1 refills | Status: DC

## 2024-03-31 NOTE — Assessment & Plan Note (Signed)
 Low-carb diet and exercise.  Follow met b and A1c.

## 2024-03-31 NOTE — Telephone Encounter (Signed)
 Orders signed.

## 2024-03-31 NOTE — Assessment & Plan Note (Signed)
 Continues on lisinopril  20mg  q day. Blood pressure as outlined. No changes in medication. Check metabolic panel.

## 2024-03-31 NOTE — Assessment & Plan Note (Signed)
 Followed by Dr Mardee. PT. Doing well.

## 2024-03-31 NOTE — Addendum Note (Signed)
 Addended by: GLENDIA ALLENA RAMAN on: 03/31/2024 10:49 AM   Modules accepted: Orders

## 2024-03-31 NOTE — Assessment & Plan Note (Signed)
 ALT increased on recent check. Recheck liver panel with fasting labs.

## 2024-03-31 NOTE — Assessment & Plan Note (Signed)
 Continue diet and exercise.

## 2024-03-31 NOTE — Assessment & Plan Note (Signed)
 Symptoms controlled on protonix  40mg  q day. Desires to decrease dose. Will decrease to 20mg  q day. Follow.

## 2024-03-31 NOTE — Assessment & Plan Note (Signed)
 S/p thyroidectomy. On levothyroxine . Check tsh.

## 2024-03-31 NOTE — Assessment & Plan Note (Signed)
 Continues on crestor. Low cholesterol diet and exercise. Follow lipid panel.

## 2024-04-01 ENCOUNTER — Other Ambulatory Visit (INDEPENDENT_AMBULATORY_CARE_PROVIDER_SITE_OTHER)

## 2024-04-01 DIAGNOSIS — Z125 Encounter for screening for malignant neoplasm of prostate: Secondary | ICD-10-CM | POA: Diagnosis not present

## 2024-04-01 DIAGNOSIS — I10 Essential (primary) hypertension: Secondary | ICD-10-CM

## 2024-04-01 DIAGNOSIS — E785 Hyperlipidemia, unspecified: Secondary | ICD-10-CM | POA: Diagnosis not present

## 2024-04-01 DIAGNOSIS — E042 Nontoxic multinodular goiter: Secondary | ICD-10-CM

## 2024-04-01 LAB — BASIC METABOLIC PANEL WITH GFR
BUN: 15 mg/dL (ref 6–23)
CO2: 26 meq/L (ref 19–32)
Calcium: 9.5 mg/dL (ref 8.4–10.5)
Chloride: 101 meq/L (ref 96–112)
Creatinine, Ser: 1.19 mg/dL (ref 0.40–1.50)
GFR: 62.67 mL/min (ref >60.00)
Glucose, Bld: 104 mg/dL — ABNORMAL HIGH (ref 70–99)
Potassium: 4.7 meq/L (ref 3.5–5.1)
Sodium: 137 meq/L (ref 135–145)

## 2024-04-01 LAB — HEPATIC FUNCTION PANEL
ALT: 35 U/L (ref 0–53)
AST: 21 U/L (ref 0–37)
Albumin: 4.4 g/dL (ref 3.5–5.2)
Alkaline Phosphatase: 90 U/L (ref 39–117)
Bilirubin, Direct: 0.1 mg/dL (ref 0.0–0.3)
Total Bilirubin: 0.6 mg/dL (ref 0.2–1.2)
Total Protein: 7.2 g/dL (ref 6.0–8.3)

## 2024-04-01 LAB — VITAMIN B12: Vitamin B-12: 633 pg/mL (ref 211–911)

## 2024-04-01 LAB — LIPID PANEL
Cholesterol: 150 mg/dL (ref 0–200)
HDL: 35.9 mg/dL — ABNORMAL LOW (ref >39.00)
LDL Cholesterol: 80 mg/dL (ref 0–99)
NonHDL: 114.51
Total CHOL/HDL Ratio: 4
Triglycerides: 171 mg/dL — ABNORMAL HIGH (ref 0.0–149.0)
VLDL: 34.2 mg/dL (ref 0.0–40.0)

## 2024-04-01 LAB — TSH: TSH: 2.5 u[IU]/mL (ref 0.35–5.50)

## 2024-04-02 ENCOUNTER — Ambulatory Visit: Payer: Self-pay | Admitting: Internal Medicine

## 2024-04-04 ENCOUNTER — Other Ambulatory Visit: Payer: Self-pay | Admitting: Internal Medicine

## 2024-04-04 LAB — PSA: PSA: 2.49 ng/mL (ref 0.10–4.00)

## 2024-04-04 NOTE — Addendum Note (Signed)
 Addended by: MARYLEN PRO A on: 04/04/2024 10:03 AM   Modules accepted: Orders

## 2024-06-12 ENCOUNTER — Ambulatory Visit (INDEPENDENT_AMBULATORY_CARE_PROVIDER_SITE_OTHER): Admitting: *Deleted

## 2024-06-12 VITALS — Ht 73.0 in | Wt 280.0 lb

## 2024-06-12 DIAGNOSIS — Z0000 Encounter for general adult medical examination without abnormal findings: Secondary | ICD-10-CM | POA: Diagnosis not present

## 2024-06-12 DIAGNOSIS — Z1159 Encounter for screening for other viral diseases: Secondary | ICD-10-CM | POA: Diagnosis not present

## 2024-06-12 NOTE — Patient Instructions (Signed)
 Andrew Grimes,  Thank you for taking the time for your Medicare Wellness Visit. I appreciate your continued commitment to your health goals. Please review the care plan we discussed, and feel free to reach out if I can assist you further.  Please note that Annual Wellness Visits do not include a physical exam. Some assessments may be limited, especially if the visit was conducted virtually. If needed, we may recommend an in-person follow-up with your provider.  Ongoing Care Seeing your primary care provider every 3 to 6 months helps us  monitor your health and provide consistent, personalized care.  Remember to keep your vaccines up to date.   Referrals If a referral was made during today's visit and you haven't received any updates within two weeks, please contact the referred provider directly to check on the status.  Recommended Screenings:  Health Maintenance  Topic Date Due   Hepatitis C Screening  Never done   COVID-19 Vaccine (3 - 2025-26 season) 01/29/2024   DTaP/Tdap/Td vaccine (4 - Td or Tdap) 11/24/2024   Medicare Annual Wellness Visit  06/12/2025   Colon Cancer Screening  02/24/2027   Pneumococcal Vaccine for age over 33  Completed   Flu Shot  Completed   Zoster (Shingles) Vaccine  Completed   Meningitis B Vaccine  Aged Out       06/12/2024   10:59 AM  Advanced Directives  Does Patient Have a Medical Advance Directive? No  Would patient like information on creating a medical advance directive? No - Patient declined    Vision: Annual vision screenings are recommended for early detection of glaucoma, cataracts, and diabetic retinopathy. These exams can also reveal signs of chronic conditions such as diabetes and high blood pressure.  Dental: Annual dental screenings help detect early signs of oral cancer, gum disease, and other conditions linked to overall health, including heart disease and diabetes.  Please see the attached documents for additional preventive care  recommendations.

## 2024-06-12 NOTE — Progress Notes (Signed)
 "  Chief Complaint  Patient presents with   Medicare Wellness     Subjective:   Andrew Grimes is a 69 y.o. male who presents for a Medicare Annual Wellness Visit.  Visit info / Clinical Intake: Medicare Wellness Visit Type:: Initial Annual Wellness Visit Persons participating in visit and providing information:: patient Medicare Wellness Visit Mode:: Telephone If telephone:: video declined Since this visit was completed virtually, some vitals may be partially provided or unavailable. Missing vitals are due to the limitations of the virtual format.: Unable to obtain vitals - no equipment If Telephone or Video please confirm:: I connected with patient using audio/video enable telemedicine. I verified patient identity with two identifiers, discussed telehealth limitations, and patient agreed to proceed. Patient Location:: Home Provider Location:: Office/Home Interpreter Needed?: No Pre-visit prep was completed: yes AWV questionnaire completed by patient prior to visit?: no Living arrangements:: lives with spouse/significant other Patient's Overall Health Status Rating: very good Typical amount of pain: some (back and knee pain for several years) Does pain affect daily life?: no Are you currently prescribed opioids?: no  Dietary Habits and Nutritional Risks How many meals a day?: 2 Eats fruit and vegetables daily?: yes Most meals are obtained by: preparing own meals In the last 2 weeks, have you had any of the following?: none Diabetic:: no  Functional Status Activities of Daily Living (to include ambulation/medication): Independent Ambulation: Independent Medication Administration: Independent Home Management (perform basic housework or laundry): Independent Manage your own finances?: yes Primary transportation is: driving Concerns about vision?: no *vision screening is required for WTM* Concerns about hearing?: no  Fall Screening Falls in the past year?: 0 Number of  falls in past year: 0 Was there an injury with Fall?: 0 Fall Risk Category Calculator: 0 Patient Fall Risk Level: Low Fall Risk  Fall Risk Patient at Risk for Falls Due to: No Fall Risks Fall risk Follow up: Falls evaluation completed; Falls prevention discussed  Home and Transportation Safety: All rugs have non-skid backing?: yes All stairs or steps have railings?: (!) no (discussed safety) Grab bars in the bathtub or shower?: (!) no (working on at this time) Have non-skid surface in bathtub or shower?: yes Good home lighting?: yes Regular seat belt use?: yes Hospital stays in the last year:: (!) yes How many hospital stays:: 1 Reason: knee surgery  Cognitive Assessment Difficulty concentrating, remembering, or making decisions? : no Will 6CIT or Mini Cog be Completed: yes What year is it?: 0 points What month is it?: 0 points Give patient an address phrase to remember (5 components): [address] [place] About what time is it?: 0 points Count backwards from 20 to 1: 0 points Say the months of the year in reverse: 0 points Repeat the address phrase from earlier: 0 points 6 CIT Score: 0 points  Advance Directives (For Healthcare) Does Patient Have a Medical Advance Directive?: No Would patient like information on creating a medical advance directive?: No - Patient declined  Reviewed/Updated  Reviewed/Updated: Reviewed All (Medical, Surgical, Family, Medications, Allergies, Care Teams, Patient Goals)    Allergies (verified) Codeine   Current Medications (verified) Outpatient Encounter Medications as of 06/12/2024  Medication Sig   levothyroxine  (SYNTHROID ) 175 MCG tablet TAKE 1 TABLET BY MOUTH EVERY MORNING BEFORE BREAKFAST. TAKE ON AN EMPTY STOMACH WITH A GLASS OF WATER AT LEAST 30 TO 60 MINUTES BEFORE BREAKFAST   lisinopril  (ZESTRIL ) 20 MG tablet TAKE 1 TABLET BY MOUTH DAILY   pantoprazole  (PROTONIX ) 20 MG  tablet Take 1 tablet (20 mg total) by mouth daily.    rosuvastatin  (CRESTOR ) 10 MG tablet TAKE 1 TABLET BY MOUTH DAILY   aspirin  81 MG chewable tablet Chew 1 tablet (81 mg total) by mouth 2 (two) times daily. (Patient not taking: Reported on 06/12/2024)   Cyanocobalamin  (B-12) 1000 MCG/ML KIT Inject 1,000 mcg into the muscle every 30 (thirty) days. (Patient not taking: Reported on 06/12/2024)   No facility-administered encounter medications on file as of 06/12/2024.    History: Past Medical History:  Diagnosis Date   GERD (gastroesophageal reflux disease)    History of kidney stones    Hyperlipidemia    Hypertension    Pre-diabetes    Past Surgical History:  Procedure Laterality Date   CATARACT EXTRACTION Bilateral    COLONOSCOPY  2016   FOOT FRACTURE SURGERY Left 1992   crushed left foot   FOOT SURGERY Left 1977   neuroma   KNEE ARTHROPLASTY Right 02/12/2024   Procedure: ARTHROPLASTY, KNEE, TOTAL, USING IMAGELESS COMPUTER-ASSISTED NAVIGATION;  Surgeon: Mardee Lynwood SQUIBB, MD;  Location: [facility] ORS;  Service: Orthopedics;  Laterality: Right;   THYROIDECTOMY     TONSILLECTOMY     child   History reviewed. No pertinent family history. Social History   Occupational History   Not on file  Tobacco Use   Smoking status: Never   Smokeless tobacco: Never  Vaping Use   Vaping status: Never Used  Substance and Sexual Activity   Alcohol use: Not Currently   Drug use: Not Currently   Sexual activity: Not Currently   Tobacco Counseling Counseling given: Not Answered  SDOH Screenings   Food Insecurity: No Food Insecurity (06/12/2024)  Housing: Low Risk (06/12/2024)  Transportation Needs: No Transportation Needs (06/12/2024)  Utilities: Not At Risk (06/12/2024)  Alcohol Screen: Low Risk (06/12/2024)  Depression (PHQ2-9): Low Risk (06/12/2024)  Recent Concern: Depression (PHQ2-9) - Medium Risk (03/26/2024)  Financial Resource Strain: Low Risk (06/12/2024)  Physical Activity: Sufficiently Active (06/12/2024)  Social Connections: Socially  Integrated (06/12/2024)  Stress: No Stress Concern Present (06/12/2024)  Tobacco Use: Low Risk (06/12/2024)  Recent Concern: Tobacco Use - Medium Risk (05/14/2024)   Received from [facility]  Health Literacy: Adequate Health Literacy (06/12/2024)   See flowsheets for full screening details  Depression Screen PHQ 2 & 9 Depression Scale- Over the past 2 weeks, how often have you been bothered by any of the following problems? Little interest or pleasure in doing things: 0 Feeling down, depressed, or hopeless (PHQ Adolescent also includes...irritable): 0 PHQ-2 Total Score: 0 Trouble falling or staying asleep, or sleeping too much: 0 Feeling tired or having little energy: 0 Poor appetite or overeating (PHQ Adolescent also includes...weight loss): 0 Feeling bad about yourself - or that you are a failure or have let yourself or your family down: 0 Trouble concentrating on things, such as reading the newspaper or watching television (PHQ Adolescent also includes...like school work): 0 Moving or speaking so slowly that other people could have noticed. Or the opposite - being so fidgety or restless that you have been moving around a lot more than usual: 0 Thoughts that you would be better off dead, or of hurting yourself in some way: 0 PHQ-9 Total Score: 0 If you checked off any problems, how difficult have these problems made it for you to do your work, take care of things at home, or get along with other people?: Not difficult at all     Goals Addressed  This Visit's Progress    Patient Stated       Wants to work on weight loss             Objective:    Today's Vitals   06/12/24 1054  Weight: 280 lb (127 kg)  Height: 6' 1 (1.854 m)   Body mass index is 36.94 kg/m.  Hearing/Vision screen Hearing Screening - Comments:: No issues Vision Screening - Comments:: No correction, [facility], up to date Immunizations and Health Maintenance Health  Maintenance  Topic Date Due   Hepatitis C Screening  Never done   COVID-19 Vaccine (3 - 2025-26 season) 01/29/2024   DTaP/Tdap/Td (4 - Td or Tdap) 11/24/2024   Medicare Annual Wellness (AWV)  06/12/2025   Colonoscopy  02/24/2027   Pneumococcal Vaccine: 50+ Years  Completed   Influenza Vaccine  Completed   Zoster Vaccines- Shingrix  Completed   Meningococcal B Vaccine  Aged Out        Assessment/Plan:  This is a routine wellness examination for Momodou.  Patient Care Team: Glendia Shad, MD as PCP - General (Internal Medicine) Mardee, Lynwood SQUIBB, MD as Consulting Physician (Orthopedic Surgery) Conny Carrier, MD as Referring Physician (Gastroenterology)  I have personally reviewed and noted the following in the patients chart:   Medical and social history Use of alcohol, tobacco or illicit drugs  Current medications and supplements including opioid prescriptions. Functional ability and status Nutritional status Physical activity Advanced directives List of other physicians Hospitalizations, surgeries, and ER visits in previous 12 months Vitals Screenings to include cognitive, depression, and falls Referrals and appointments  Orders Placed This Encounter  Procedures   Hepatitis C Antibody   In addition, I have reviewed and discussed with patient certain preventive protocols, quality metrics, and best practice recommendations. A written personalized care plan for preventive services as well as general preventive health recommendations were provided to patient.   Angeline Fredericks, LPN   8/85/7973   Return in 1 year (on 06/12/2025).  After Visit Summary: (MyChart) Due to this being a telephonic visit, the after visit summary with patients personalized plan was offered to patient via MyChart   Nurse Notes: Hepatitis C screening ordered. Patient declines covid vaccine.  "

## 2024-07-03 ENCOUNTER — Ambulatory Visit: Payer: Self-pay | Admitting: Internal Medicine

## 2024-07-03 ENCOUNTER — Encounter: Payer: Self-pay | Admitting: Internal Medicine

## 2024-07-03 ENCOUNTER — Ambulatory Visit: Admitting: Internal Medicine

## 2024-07-03 VITALS — BP 122/78 | HR 79 | Temp 98.3°F | Ht 73.0 in | Wt 287.2 lb

## 2024-07-03 DIAGNOSIS — I10 Essential (primary) hypertension: Secondary | ICD-10-CM

## 2024-07-03 DIAGNOSIS — E785 Hyperlipidemia, unspecified: Secondary | ICD-10-CM

## 2024-07-03 DIAGNOSIS — Z0000 Encounter for general adult medical examination without abnormal findings: Secondary | ICD-10-CM | POA: Insufficient documentation

## 2024-07-03 LAB — LIPID PANEL
Cholesterol: 167 mg/dL (ref 28–200)
HDL: 40.3 mg/dL
LDL Cholesterol: 100 mg/dL — ABNORMAL HIGH (ref 10–99)
NonHDL: 126.64
Total CHOL/HDL Ratio: 4
Triglycerides: 135 mg/dL (ref 10.0–149.0)
VLDL: 27 mg/dL (ref 0.0–40.0)

## 2024-07-03 LAB — BASIC METABOLIC PANEL WITH GFR
BUN: 14 mg/dL (ref 6–23)
CO2: 23 meq/L (ref 19–32)
Calcium: 9.8 mg/dL (ref 8.4–10.5)
Chloride: 105 meq/L (ref 96–112)
Creatinine, Ser: 1.11 mg/dL (ref 0.40–1.50)
GFR: 68.01 mL/min
Glucose, Bld: 110 mg/dL — ABNORMAL HIGH (ref 70–99)
Potassium: 4.5 meq/L (ref 3.5–5.1)
Sodium: 140 meq/L (ref 135–145)

## 2024-07-03 LAB — HEPATIC FUNCTION PANEL
ALT: 33 U/L (ref 3–53)
AST: 22 U/L (ref 5–37)
Albumin: 4.4 g/dL (ref 3.5–5.2)
Alkaline Phosphatase: 88 U/L (ref 39–117)
Bilirubin, Direct: 0.1 mg/dL (ref 0.1–0.3)
Total Bilirubin: 0.6 mg/dL (ref 0.2–1.2)
Total Protein: 7 g/dL (ref 6.0–8.3)

## 2024-07-03 MED ORDER — LEVOTHYROXINE SODIUM 175 MCG PO TABS: 175.0000 ug | ORAL_TABLET | Freq: Every day | ORAL | 1 refills | Status: AC

## 2024-07-03 MED ORDER — LISINOPRIL 20 MG PO TABS: 20.0000 mg | ORAL_TABLET | Freq: Every day | ORAL | 1 refills | Status: AC

## 2024-07-03 MED ORDER — ROSUVASTATIN CALCIUM 10 MG PO TABS: 10.0000 mg | ORAL_TABLET | Freq: Every day | ORAL | 1 refills | Status: AC

## 2024-07-03 MED ORDER — PANTOPRAZOLE SODIUM 20 MG PO TBEC: 20.0000 mg | DELAYED_RELEASE_TABLET | Freq: Every day | ORAL | 1 refills | Status: AC

## 2024-07-03 NOTE — Progress Notes (Unsigned)
 "  Subjective:    Patient ID: Andrew Grimes, male    DOB: 1955-12-27, 69 y.o.   MRN: [id number]  Patient here for No chief complaint on file.   HPI Here for a physical exam. Established care with me 03/26/24. Has a history of hypertension, hypercholesterolemia, hypothyroidism s/p thyroidectomy (for goiter) and pre diabetes. Last visit, protonix  decreased to 20mg  q day given GERD symptoms controlled. Had f/u with Dr Mardee 05/14/24 - f/u right TKA. Stable. Continue home exercise.    Past Medical History:  Diagnosis Date   GERD (gastroesophageal reflux disease)    History of kidney stones    Hyperlipidemia    Hypertension    Pre-diabetes    Past Surgical History:  Procedure Laterality Date   CATARACT EXTRACTION Bilateral    COLONOSCOPY  2016   FOOT FRACTURE SURGERY Left 1992   crushed left foot   FOOT SURGERY Left 1977   neuroma   KNEE ARTHROPLASTY Right 02/12/2024   Procedure: ARTHROPLASTY, KNEE, TOTAL, USING IMAGELESS COMPUTER-ASSISTED NAVIGATION;  Surgeon: Mardee Lynwood SQUIBB, MD;  Location: [facility] ORS;  Service: Orthopedics;  Laterality: Right;   THYROIDECTOMY     TONSILLECTOMY     child   No family history on file. Social History   Socioeconomic History   Marital status: Married    Spouse name: Not on file   Number of children: Not on file   Years of education: Not on file   Highest education level: 12th grade  Occupational History   Not on file  Tobacco Use   Smoking status: Never   Smokeless tobacco: Never  Vaping Use   Vaping status: Never Used  Substance and Sexual Activity   Alcohol use: Not Currently   Drug use: Not Currently   Sexual activity: Not Currently  Other Topics Concern   Not on file  Social History Narrative   married   Social Drivers of Health   Tobacco Use: Low Risk (06/12/2024)   Patient History    Smoking Tobacco Use: Never    Smokeless Tobacco Use: Never    Passive Exposure: Not on file  Recent Concern: Tobacco Use - Medium Risk  (05/14/2024)   Received from [facility]   Patient History    Smoking Tobacco Use: Never    Smokeless Tobacco Use: Former    Passive Exposure: Not on Actuary Strain: Low Risk (06/12/2024)   Overall Financial Resource Strain (CARDIA)    Difficulty of Paying Living Expenses: Not hard at all  Food Insecurity: No Food Insecurity (06/12/2024)   Epic    Worried About Programme Researcher, Broadcasting/film/video in the Last Year: Never true    Ran Out of Food in the Last Year: Never true  Transportation Needs: No Transportation Needs (06/12/2024)   Epic    Lack of Transportation (Medical): No    Lack of Transportation (Non-Medical): No  Physical Activity: Sufficiently Active (06/12/2024)   Exercise Vital Sign    Days of Exercise per Week: 4 days    Minutes of Exercise per Session: 60 min  Stress: No Stress Concern Present (06/12/2024)   [employer] of Occupational Health - Occupational Stress Questionnaire    Feeling of Stress: Not at all  Social Connections: Socially Integrated (06/12/2024)   Social Connection and Isolation Panel    Frequency of Communication with Friends and Family: More than three times a week    Frequency of Social Gatherings with Friends and Family: More than three times a  week    Attends Religious Services: More than 4 times per year    Active Member of Clubs or Organizations: Yes    Attends Banker Meetings: More than 4 times per year    Marital Status: Married  Depression (PHQ2-9): Low Risk (06/12/2024)   Depression (PHQ2-9)    PHQ-2 Score: 0  Recent Concern: Depression (PHQ2-9) - Medium Risk (03/26/2024)   Depression (PHQ2-9)    PHQ-2 Score: 6  Alcohol Screen: Low Risk (06/12/2024)   Alcohol Screen    Last Alcohol Screening Score (AUDIT): 0  Housing: Low Risk (06/12/2024)   Epic    Unable to Pay for Housing in the Last Year: No    Number of Times Moved in the Last Year: 0    Homeless in the Last Year: No  Utilities: Not At Risk  (06/12/2024)   Epic    Threatened with loss of utilities: No  Health Literacy: Adequate Health Literacy (06/12/2024)   B1300 Health Literacy    Frequency of need for help with medical instructions: Never     Review of Systems     Objective:     There were no vitals taken for this visit. Wt Readings from Last 3 Encounters:  06/12/24 280 lb (127 kg)  03/26/24 287 lb 6.4 oz (130.4 kg)  02/12/24 282 lb (127.9 kg)    Physical Exam  {Perform Simple Foot Exam  Perform Detailed exam:1} {Insert foot Exam (Optional):30965}   Outpatient Encounter Medications as of 07/03/2024  Medication Sig   aspirin  81 MG chewable tablet Chew 1 tablet (81 mg total) by mouth 2 (two) times daily. (Patient not taking: Reported on 06/12/2024)   Cyanocobalamin  (B-12) 1000 MCG/ML KIT Inject 1,000 mcg into the muscle every 30 (thirty) days. (Patient not taking: Reported on 06/12/2024)   levothyroxine  (SYNTHROID ) 175 MCG tablet TAKE 1 TABLET BY MOUTH EVERY MORNING BEFORE BREAKFAST. TAKE ON AN EMPTY STOMACH WITH A GLASS OF WATER AT LEAST 30 TO 60 MINUTES BEFORE BREAKFAST   lisinopril  (ZESTRIL ) 20 MG tablet TAKE 1 TABLET BY MOUTH DAILY   pantoprazole  (PROTONIX ) 20 MG tablet Take 1 tablet (20 mg total) by mouth daily.   rosuvastatin  (CRESTOR ) 10 MG tablet TAKE 1 TABLET BY MOUTH DAILY   No facility-administered encounter medications on file as of 07/03/2024.     Lab Results  Component Value Date   WBC 6.7 02/02/2024   HGB 16.1 02/02/2024   HCT 47.6 02/02/2024   PLT 346 02/02/2024   GLUCOSE 104 (H) 04/01/2024   CHOL 150 04/01/2024   TRIG 171.0 (H) 04/01/2024   HDL 35.90 (L) 04/01/2024   LDLCALC 80 04/01/2024   ALT 35 04/01/2024   AST 21 04/01/2024   NA 137 04/01/2024   K 4.7 04/01/2024   CL 101 04/01/2024   CREATININE 1.19 04/01/2024   BUN 15 04/01/2024   CO2 26 04/01/2024   TSH 2.50 04/01/2024   PSA 2.49 04/04/2024   HGBA1C 6.1 (H) 02/02/2024    DG Knee Right Port Result Date: 02/12/2024 CLINICAL  DATA:  Status post total knee replacement. EXAM: PORTABLE RIGHT KNEE - 1-2 VIEW COMPARISON:  None Available. FINDINGS: Status post right total knee arthroplasty with normal alignment. No acute fracture. Expected postoperative soft tissue swelling, soft tissue air, cutaneous staples, and drain anteriorly. IMPRESSION: Status post right total knee arthroplasty with normal alignment. Electronically Signed   By: Harrietta Sherry M.D.   On: 02/12/2024 11:17       Assessment & Plan:  Hyperlipidemia, unspecified hyperlipidemia type  Essential hypertension     Allena Hamilton, MD "

## 2024-07-03 NOTE — Assessment & Plan Note (Signed)
 Physical today 07/03/24. PSA 04/04/24 - 2.49. colonoscopy ?

## 2024-12-31 ENCOUNTER — Ambulatory Visit: Admitting: Internal Medicine

## 2025-06-17 ENCOUNTER — Ambulatory Visit
